# Patient Record
Sex: Male | Born: 1970
Health system: Southern US, Community
[De-identification: ages and names within clinical notes are randomized; demographics above are authoritative.]

## PROBLEM LIST (undated history)

## (undated) DIAGNOSIS — C801 Malignant (primary) neoplasm, unspecified: Secondary | ICD-10-CM

## (undated) HISTORY — PX: HERNIA REPAIR: SHX51

## (undated) HISTORY — PX: KNEE SURGERY: SHX244

## (undated) HISTORY — PX: PILONIDAL CYST EXCISION: SHX744

## (undated) HISTORY — PX: DENTAL SURGERY: SHX609

---

## 2001-04-17 ENCOUNTER — Ambulatory Visit (HOSPITAL_COMMUNITY): Admission: RE | Admit: 2001-04-17 | Discharge: 2001-04-17 | Payer: Self-pay | Admitting: *Deleted

## 2009-05-06 ENCOUNTER — Ambulatory Visit: Payer: Self-pay | Admitting: Cardiovascular Disease

## 2009-05-06 ENCOUNTER — Observation Stay (HOSPITAL_COMMUNITY): Admission: EM | Admit: 2009-05-06 | Discharge: 2009-05-07 | Payer: Self-pay | Admitting: Emergency Medicine

## 2010-06-24 LAB — URINALYSIS, ROUTINE W REFLEX MICROSCOPIC
Bilirubin Urine: NEGATIVE
Glucose, UA: NEGATIVE mg/dL
Hgb urine dipstick: NEGATIVE
Ketones, ur: NEGATIVE mg/dL
Nitrite: NEGATIVE
Protein, ur: NEGATIVE mg/dL
Specific Gravity, Urine: 1.006 (ref 1.005–1.030)
Urobilinogen, UA: 0.2 mg/dL (ref 0.0–1.0)
pH: 6 (ref 5.0–8.0)

## 2010-06-24 LAB — CBC
HCT: 50.3 % (ref 39.0–52.0)
Hemoglobin: 17.4 g/dL — ABNORMAL HIGH (ref 13.0–17.0)
MCHC: 34.5 g/dL (ref 30.0–36.0)
MCV: 90.8 fL (ref 78.0–100.0)
Platelets: 154 10*3/uL (ref 150–400)
RBC: 5.54 MIL/uL (ref 4.22–5.81)
RDW: 13.6 % (ref 11.5–15.5)
WBC: 13.1 10*3/uL — ABNORMAL HIGH (ref 4.0–10.5)

## 2010-06-24 LAB — DIFFERENTIAL
Basophils Absolute: 0 10*3/uL (ref 0.0–0.1)
Eosinophils Relative: 5 % (ref 0–5)
Lymphocytes Relative: 15 % (ref 12–46)
Lymphs Abs: 2 10*3/uL (ref 0.7–4.0)
Neutro Abs: 9.7 10*3/uL — ABNORMAL HIGH (ref 1.7–7.7)
Neutrophils Relative %: 74 % (ref 43–77)

## 2010-06-24 LAB — TROPONIN I
Troponin I: 0.01 ng/mL (ref 0.00–0.06)
Troponin I: 0.01 ng/mL (ref 0.00–0.06)

## 2010-06-24 LAB — BASIC METABOLIC PANEL
BUN: 6 mg/dL (ref 6–23)
CO2: 27 mEq/L (ref 19–32)
Calcium: 8.8 mg/dL (ref 8.4–10.5)
Chloride: 102 mEq/L (ref 96–112)
Creatinine, Ser: 0.97 mg/dL (ref 0.4–1.5)
GFR calc Af Amer: >60 mL/min (ref >60)
GFR calc non Af Amer: >60 mL/min (ref >60)
Glucose, Bld: 105 mg/dL — ABNORMAL HIGH (ref 70–99)
Potassium: 4.1 mEq/L (ref 3.5–5.1)
Sodium: 135 mEq/L (ref 135–145)

## 2010-06-24 LAB — CK TOTAL AND CKMB (NOT AT ARMC)
Relative Index: 1.1 (ref 0.0–2.5)
Relative Index: INVALID (ref 0.0–2.5)

## 2010-06-24 LAB — POCT CARDIAC MARKERS
CKMB, poc: <1 ng/mL — ABNORMAL LOW (ref 1.0–8.0)
Myoglobin, poc: 59.4 ng/mL (ref 12–200)
Troponin i, poc: <0.05 ng/mL (ref 0.00–0.09)

## 2014-10-31 ENCOUNTER — Ambulatory Visit (INDEPENDENT_AMBULATORY_CARE_PROVIDER_SITE_OTHER): Payer: Self-pay | Admitting: Family Medicine

## 2014-10-31 VITALS — BP 110/78 | HR 91 | Temp 97.9°F | Resp 18 | Ht 71.5 in | Wt 216.0 lb

## 2014-10-31 DIAGNOSIS — M545 Low back pain, unspecified: Secondary | ICD-10-CM

## 2014-10-31 DIAGNOSIS — S39012A Strain of muscle, fascia and tendon of lower back, initial encounter: Secondary | ICD-10-CM

## 2014-10-31 MED ORDER — MELOXICAM 15 MG PO TABS: 15.0000 mg | ORAL_TABLET | Freq: Every day | ORAL | Status: DC

## 2014-10-31 MED ORDER — METHOCARBAMOL 500 MG PO TABS: 500.0000 mg | ORAL_TABLET | Freq: Four times a day (QID) | ORAL | Status: DC

## 2014-10-31 NOTE — Patient Instructions (Signed)
Low Back Sprain with Rehab  A sprain is an injury in which a ligament is torn. The ligaments of the lower back are vulnerable to sprains. However, they are strong and require great force to be injured. These ligaments are important for stabilizing the spinal column. Sprains are classified into three categories. Grade 1 sprains cause pain, but the tendon is not lengthened. Grade 2 sprains include a lengthened ligament, due to the ligament being stretched or partially ruptured. With grade 2 sprains there is still function, although the function may be decreased. Grade 3 sprains involve a complete tear of the tendon or muscle, and function is usually impaired. SYMPTOMS   Severe pain in the lower back.  Sometimes, a feeling of a "pop," "snap," or tear, at the time of injury.  Tenderness and sometimes swelling at the injury site.  Uncommonly, bruising (contusion) within 48 hours of injury.  Muscle spasms in the back. CAUSES  Low back sprains occur when a force is placed on the ligaments that is greater than they can handle. Common causes of injury include:  Performing a stressful act while off-balance.  Repetitive stressful activities that involve movement of the lower back.  Direct hit (trauma) to the lower back. RISK INCREASES WITH:  Contact sports (football, wrestling).  Collisions (major skiing accidents).  Sports that require throwing or lifting (baseball, weightlifting).  Sports involving twisting of the spine (gymnastics, diving, tennis, golf).  Poor strength and flexibility.  Inadequate protection.  Previous back injury or surgery (especially fusion). PREVENTION  Wear properly fitted and padded protective equipment.  Warm up and stretch properly before activity.  Allow for adequate recovery between workouts.  Maintain physical fitness:  Strength, flexibility, and endurance.  Cardiovascular fitness.  Maintain a healthy body weight. PROGNOSIS  If treated  properly, low back sprains usually heal with non-surgical treatment. The length of time for healing depends on the severity of the injury.  RELATED COMPLICATIONS   Recurring symptoms, resulting in a chronic problem.  Chronic inflammation and pain in the low back.  Delayed healing or resolution of symptoms, especially if activity is resumed too soon.  Prolonged impairment.  Unstable or arthritic joints of the low back. TREATMENT  Treatment first involves the use of ice and medicine, to reduce pain and inflammation. The use of strengthening and stretching exercises may help reduce pain with activity. These exercises may be performed at home or with a therapist. Severe injuries may require referral to a therapist for further evaluation and treatment, such as ultrasound. Your caregiver may advise that you wear a back brace or corset, to help reduce pain and discomfort. Often, prolonged bed rest results in greater harm then benefit. Corticosteroid injections may be recommended. However, these should be reserved for the most serious cases. It is important to avoid using your back when lifting objects. At night, sleep on your back on a firm mattress, with a pillow placed under your knees. If non-surgical treatment is unsuccessful, surgery may be needed.  MEDICATION   If pain medicine is needed, nonsteroidal anti-inflammatory medicines (aspirin and ibuprofen), or other minor pain relievers (acetaminophen), are often advised.  Do not take pain medicine for 7 days before surgery.  Prescription pain relievers may be given, if your caregiver thinks they are needed. Use only as directed and only as much as you need.  Ointments applied to the skin may be helpful.  Corticosteroid injections may be given by your caregiver. These injections should be reserved for the most serious cases,   because they may only be given a certain number of times. HEAT AND COLD  Cold treatment (icing) should be applied for 10  to 15 minutes every 2 to 3 hours for inflammation and pain, and immediately after activity that aggravates your symptoms. Use ice packs or an ice massage.  Heat treatment may be used before performing stretching and strengthening activities prescribed by your caregiver, physical therapist, or athletic trainer. Use a heat pack or a warm water soak. SEEK MEDICAL CARE IF:   Symptoms get worse or do not improve in 2 to 4 weeks, despite treatment.  You develop numbness or weakness in either leg.  You lose bowel or bladder function.  Any of the following occur after surgery: fever, increased pain, swelling, redness, drainage of fluids, or bleeding in the affected area.  New, unexplained symptoms develop. (Drugs used in treatment may produce side effects.) EXERCISES  RANGE OF MOTION (ROM) AND STRETCHING EXERCISES - Low Back Sprain Most people with lower back pain will find that their symptoms get worse with excessive bending forward (flexion) or arching at the lower back (extension). The exercises that will help resolve your symptoms will focus on the opposite motion.  Your physician, physical therapist or athletic trainer will help you determine which exercises will be most helpful to resolve your lower back pain. Do not complete any exercises without first consulting with your caregiver. Discontinue any exercises which make your symptoms worse, until you speak to your caregiver. If you have pain, numbness or tingling which travels down into your buttocks, leg or foot, the goal of the therapy is for these symptoms to move closer to your back and eventually resolve. Sometimes, these leg symptoms will get better, but your lower back pain may worsen. This is often an indication of progress in your rehabilitation. Be very alert to any changes in your symptoms and the activities in which you participated in the 24 hours prior to the change. Sharing this information with your caregiver will allow him or her to  most efficiently treat your condition. These exercises may help you when beginning to rehabilitate your injury. Your symptoms may resolve with or without further involvement from your physician, physical therapist or athletic trainer. While completing these exercises, remember:   Restoring tissue flexibility helps normal motion to return to the joints. This allows healthier, less painful movement and activity.  An effective stretch should be held for at least 30 seconds.  A stretch should never be painful. You should only feel a gentle lengthening or release in the stretched tissue. FLEXION RANGE OF MOTION AND STRETCHING EXERCISES: STRETCH - Flexion, Single Knee to Chest   Lie on a firm bed or floor with both legs extended in front of you.  Keeping one leg in contact with the floor, bring your opposite knee to your chest. Hold your leg in place by either grabbing behind your thigh or at your knee.  Pull until you feel a gentle stretch in your low back. Hold __________ seconds.  Slowly release your grasp and repeat the exercise with the opposite side. Repeat __________ times. Complete this exercise __________ times per day.  STRETCH - Flexion, Double Knee to Chest  Lie on a firm bed or floor with both legs extended in front of you.  Keeping one leg in contact with the floor, bring your opposite knee to your chest.  Tense your stomach muscles to support your back and then lift your other knee to your chest. Hold your legs   in place by either grabbing behind your thighs or at your knees.  Pull both knees toward your chest until you feel a gentle stretch in your low back. Hold __________ seconds.  Tense your stomach muscles and slowly return one leg at a time to the floor. Repeat __________ times. Complete this exercise __________ times per day.  STRETCH - Low Trunk Rotation  Lie on a firm bed or floor. Keeping your legs in front of you, bend your knees so they are both pointed toward the  ceiling and your feet are flat on the floor.  Extend your arms out to the side. This will stabilize your upper body by keeping your shoulders in contact with the floor.  Gently and slowly drop both knees together to one side until you feel a gentle stretch in your low back. Hold for __________ seconds.  Tense your stomach muscles to support your lower back as you bring your knees back to the starting position. Repeat the exercise to the other side. Repeat __________ times. Complete this exercise __________ times per day  EXTENSION RANGE OF MOTION AND FLEXIBILITY EXERCISES: STRETCH - Extension, Prone on Elbows   Lie on your stomach on the floor, a bed will be too soft. Place your palms about shoulder width apart and at the height of your head.  Place your elbows under your shoulders. If this is too painful, stack pillows under your chest.  Allow your body to relax so that your hips drop lower and make contact more completely with the floor.  Hold this position for __________ seconds.  Slowly return to lying flat on the floor. Repeat __________ times. Complete this exercise __________ times per day.  RANGE OF MOTION - Extension, Prone Press Ups  Lie on your stomach on the floor, a bed will be too soft. Place your palms about shoulder width apart and at the height of your head.  Keeping your back as relaxed as possible, slowly straighten your elbows while keeping your hips on the floor. You may adjust the placement of your hands to maximize your comfort. As you gain motion, your hands will come more underneath your shoulders.  Hold this position __________ seconds.  Slowly return to lying flat on the floor. Repeat __________ times. Complete this exercise __________ times per day.  RANGE OF MOTION- Quadruped, Neutral Spine   Assume a hands and knees position on a firm surface. Keep your hands under your shoulders and your knees under your hips. You may place padding under your knees for  comfort.  Drop your head and point your tailbone toward the ground below you. This will round out your lower back like an angry cat. Hold this position for __________ seconds.  Slowly lift your head and release your tail bone so that your back sags into a large arch, like an old horse.  Hold this position for __________ seconds.  Repeat this until you feel limber in your low back.  Now, find your "sweet spot." This will be the most comfortable position somewhere between the two previous positions. This is your neutral spine. Once you have found this position, tense your stomach muscles to support your low back.  Hold this position for __________ seconds. Repeat __________ times. Complete this exercise __________ times per day.  STRENGTHENING EXERCISES - Low Back Sprain These exercises may help you when beginning to rehabilitate your injury. These exercises should be done near your "sweet spot." This is the neutral, low-back arch, somewhere between fully rounded   and fully arched, that is your least painful position. When performed in this safe range of motion, these exercises can be used for people who have either a flexion or extension based injury. These exercises may resolve your symptoms with or without further involvement from your physician, physical therapist or athletic trainer. While completing these exercises, remember:   Muscles can gain both the endurance and the strength needed for everyday activities through controlled exercises.  Complete these exercises as instructed by your physician, physical therapist or athletic trainer. Increase the resistance and repetitions only as guided.  You may experience muscle soreness or fatigue, but the pain or discomfort you are trying to eliminate should never worsen during these exercises. If this pain does worsen, stop and make certain you are following the directions exactly. If the pain is still present after adjustments, discontinue the  exercise until you can discuss the trouble with your caregiver. STRENGTHENING - Deep Abdominals, Pelvic Tilt   Lie on a firm bed or floor. Keeping your legs in front of you, bend your knees so they are both pointed toward the ceiling and your feet are flat on the floor.  Tense your lower abdominal muscles to press your low back into the floor. This motion will rotate your pelvis so that your tail bone is scooping upwards rather than pointing at your feet or into the floor. With a gentle tension and even breathing, hold this position for __________ seconds. Repeat __________ times. Complete this exercise __________ times per day.  STRENGTHENING - Abdominals, Crunches   Lie on a firm bed or floor. Keeping your legs in front of you, bend your knees so they are both pointed toward the ceiling and your feet are flat on the floor. Cross your arms over your chest.  Slightly tip your chin down without bending your neck.  Tense your abdominals and slowly lift your trunk high enough to just clear your shoulder blades. Lifting higher can put excessive stress on the lower back and does not further strengthen your abdominal muscles.  Control your return to the starting position. Repeat __________ times. Complete this exercise __________ times per day.  STRENGTHENING - Quadruped, Opposite UE/LE Lift   Assume a hands and knees position on a firm surface. Keep your hands under your shoulders and your knees under your hips. You may place padding under your knees for comfort.  Find your neutral spine and gently tense your abdominal muscles so that you can maintain this position. Your shoulders and hips should form a rectangle that is parallel with the floor and is not twisted.  Keeping your trunk steady, lift your right hand no higher than your shoulder and then your left leg no higher than your hip. Make sure you are not holding your breath. Hold this position for __________ seconds.  Continuing to keep  your abdominal muscles tense and your back steady, slowly return to your starting position. Repeat with the opposite arm and leg. Repeat __________ times. Complete this exercise __________ times per day.  STRENGTHENING - Abdominals and Quadriceps, Straight Leg Raise   Lie on a firm bed or floor with both legs extended in front of you.  Keeping one leg in contact with the floor, bend the other knee so that your foot can rest flat on the floor.  Find your neutral spine, and tense your abdominal muscles to maintain your spinal position throughout the exercise.  Slowly lift your straight leg off the floor about 6 inches for a count   of 15, making sure to not hold your breath.  Still keeping your neutral spine, slowly lower your leg all the way to the floor. Repeat this exercise with each leg __________ times. Complete this exercise __________ times per day. POSTURE AND BODY MECHANICS CONSIDERATIONS - Low Back Sprain Keeping correct posture when sitting, standing or completing your activities will reduce the stress put on different body tissues, allowing injured tissues a chance to heal and limiting painful experiences. The following are general guidelines for improved posture. Your physician or physical therapist will provide you with any instructions specific to your needs. While reading these guidelines, remember:  The exercises prescribed by your provider will help you have the flexibility and strength to maintain correct postures.  The correct posture provides the best environment for your joints to work. All of your joints have less wear and tear when properly supported by a spine with good posture. This means you will experience a healthier, less painful body.  Correct posture must be practiced with all of your activities, especially prolonged sitting and standing. Correct posture is as important when doing repetitive low-stress activities (typing) as it is when doing a single heavy-load  activity (lifting). RESTING POSITIONS Consider which positions are most painful for you when choosing a resting position. If you have pain with flexion-based activities (sitting, bending, stooping, squatting), choose a position that allows you to rest in a less flexed posture. You would want to avoid curling into a fetal position on your side. If your pain worsens with extension-based activities (prolonged standing, working overhead), avoid resting in an extended position such as sleeping on your stomach. Most people will find more comfort when they rest with their spine in a more neutral position, neither too rounded nor too arched. Lying on a non-sagging bed on your side with a pillow between your knees, or on your back with a pillow under your knees will often provide some relief. Keep in mind, being in any one position for a prolonged period of time, no matter how correct your posture, can still lead to stiffness. PROPER SITTING POSTURE In order to minimize stress and discomfort on your spine, you must sit with correct posture. Sitting with good posture should be effortless for a healthy body. Returning to good posture is a gradual process. Many people can work toward this most comfortably by using various supports until they have the flexibility and strength to maintain this posture on their own. When sitting with proper posture, your ears will fall over your shoulders and your shoulders will fall over your hips. You should use the back of the chair to support your upper back. Your lower back will be in a neutral position, just slightly arched. You may place a small pillow or folded towel at the base of your lower back for  support.  When working at a desk, create an environment that supports good, upright posture. Without extra support, muscles tire, which leads to excessive strain on joints and other tissues. Keep these recommendations in mind: CHAIR:  A chair should be able to slide under your desk  when your back makes contact with the back of the chair. This allows you to work closely.  The chair's height should allow your eyes to be level with the upper part of your monitor and your hands to be slightly lower than your elbows. BODY POSITION  Your feet should make contact with the floor. If this is not possible, use a foot rest.  Keep your   ears over your shoulders. This will reduce stress on your neck and low back. INCORRECT SITTING POSTURES  If you are feeling tired and unable to assume a healthy sitting posture, do not slouch or slump. This puts excessive strain on your back tissues, causing more damage and pain. Healthier options include:  Using more support, like a lumbar pillow.  Switching tasks to something that requires you to be upright or walking.  Talking a brief walk.  Lying down to rest in a neutral-spine position. PROLONGED STANDING WHILE SLIGHTLY LEANING FORWARD  When completing a task that requires you to lean forward while standing in one place for a long time, place either foot up on a stationary 2-4 inch high object to help maintain the best posture. When both feet are on the ground, the lower back tends to lose its slight inward curve. If this curve flattens (or becomes too large), then the back and your other joints will experience too much stress, tire more quickly, and can cause pain. CORRECT STANDING POSTURES Proper standing posture should be assumed with all daily activities, even if they only take a few moments, like when brushing your teeth. As in sitting, your ears should fall over your shoulders and your shoulders should fall over your hips. You should keep a slight tension in your abdominal muscles to brace your spine. Your tailbone should point down to the ground, not behind your body, resulting in an over-extended swayback posture.  INCORRECT STANDING POSTURES  Common incorrect standing postures include a forward head, locked knees and/or an excessive  swayback. WALKING Walk with an upright posture. Your ears, shoulders and hips should all line-up. PROLONGED ACTIVITY IN A FLEXED POSITION When completing a task that requires you to bend forward at your waist or lean over a low surface, try to find a way to stabilize 3 out of 4 of your limbs. You can place a hand or elbow on your thigh or rest a knee on the surface you are reaching across. This will provide you more stability, so that your muscles do not tire as quickly. By keeping your knees relaxed, or slightly bent, you will also reduce stress across your lower back. CORRECT LIFTING TECHNIQUES DO :  Assume a wide stance. This will provide you more stability and the opportunity to get as close as possible to the object which you are lifting.  Tense your abdominals to brace your spine. Bend at the knees and hips. Keeping your back locked in a neutral-spine position, lift using your leg muscles. Lift with your legs, keeping your back straight.  Test the weight of unknown objects before attempting to lift them.  Try to keep your elbows locked down at your sides in order get the best strength from your shoulders when carrying an object.  Always ask for help when lifting heavy or awkward objects. INCORRECT LIFTING TECHNIQUES DO NOT:   Lock your knees when lifting, even if it is a small object.  Bend and twist. Pivot at your feet or move your feet when needing to change directions.  Assume that you can safely pick up even a paperclip without proper posture. Document Released: 03/20/2005 Document Revised: 06/12/2011 Document Reviewed: 07/02/2008 [employer] Patient Information 2015 [employer]. This information is not intended to replace advice given to you by your health care provider. Make sure you discuss any questions you have with your health care provider.  

## 2014-10-31 NOTE — Progress Notes (Signed)
Subjective:    Patient ID: Aaron Davidson, male    DOB: December 14, 1970, 44 y.o.   MRN: [id number]  10/31/2014  Back Pain   HPI This 44 y.o. male presents for evaluation of lower back pain.  Does appliance work and drives frequently.  Onset two months ago.  Pain moves from one side of back to the other. Taking Tylenol and Naproxen every morning with persistence; will repeat Tylenol dosing throughout the day.  No previous evaluation for pain.  No injury or trigger; lifts constantly.  Routes have been elongated; traveling more.  B lower back and radiates up. No radiation into legs except into L hip intermittently.  No n/t/burning in legs or arms.  No weakness.  L hip after mowing hurts really badly; has a high heel; push mower.  B/b function intact.  No saddle paresthesias.  Heat and ice; heating pad more with some relief.  No nighttime awakening.  PCP:  none   Review of Systems  Constitutional: Negative for fever, chills, diaphoresis and fatigue.  Genitourinary: Negative for decreased urine volume and difficulty urinating.  Musculoskeletal: Positive for myalgias and back pain. Negative for gait problem.  Skin: Negative for rash.  Neurological: Negative for weakness and numbness.    History reviewed. No pertinent past medical history. Past Surgical History  Procedure Laterality Date  . Knee surgery Right    Allergies  Allergen Reactions  . Ultram [Tramadol Hcl] Hives, Itching and Rash   Current Outpatient Prescriptions  Medication Sig Dispense Refill  . meloxicam (MOBIC) 15 MG tablet Take 1 tablet (15 mg total) by mouth daily. 30 tablet 0  . methocarbamol (ROBAXIN) 500 MG tablet Take 1-2 tablets (500-1,000 mg total) by mouth 4 (four) times daily. 40 tablet 1   No current facility-administered medications for this visit.   History   Social History  . Marital Status: Single    Spouse Name: N/A  . Number of Children: N/A  . Years of Education: N/A   Occupational History  .  Not on file.   Social History Main Topics  . Smoking status: Current Every Day Smoker -- 1.00 packs/day for 36 years    Types: Cigarettes  . Smokeless tobacco: Not on file  . Alcohol Use: 0.6 oz/week    1 Standard drinks or equivalent per week  . Drug Use: No  . Sexual Activity: Not on file   Other Topics Concern  . Not on file   Social History Narrative  . No narrative on file       Objective:    BP 110/78 mmHg  Pulse 91  Temp(Src) 97.9 F (36.6 C) (Oral)  Resp 18  Ht 5' 11.5" (1.816 m)  Wt 216 lb (97.977 kg)  BMI 29.71 kg/m2  SpO2 99% Physical Exam  Constitutional: He is oriented to person, place, and time. He appears well-developed and well-nourished. No distress.  HENT:  Head: Normocephalic and atraumatic.  Eyes: Conjunctivae and EOM are normal. Pupils are equal, round, and reactive to light.  Neck: Normal range of motion. Neck supple. Carotid bruit is not present. No thyromegaly present.  Cardiovascular: Intact distal pulses.   Pulmonary/Chest: Effort normal and breath sounds normal. He has no wheezes. He has no rales.  Musculoskeletal:       Lumbar back: He exhibits pain. He exhibits normal range of motion, no tenderness and no bony tenderness.  Lumbar spine:  Non-tender midline; non-tender paraspinal regions B.  Straight leg raises negative B; toe and heel  walking intact; marching intact; motor 5/5 BLE.  Full ROM lumbar spine without limitation. +pain with extension of back and bending and rotating side to side.   Lymphadenopathy:    He has no cervical adenopathy.  Neurological: He is alert and oriented to person, place, and time. No cranial nerve deficit.  Skin: Skin is warm and dry. No rash noted. He is not diaphoretic.  Psychiatric: He has a normal mood and affect. His behavior is normal.  Nursing note and vitals reviewed.       Assessment & Plan:   1. Bilateral low back pain without sciatica   2. Lumbar strain, initial encounter    -New. -Treat  with Meloxicam and Robaxin daily. -Continue heat bid for 15-20 minutes. -Home exercise program provived to perform daily. -If no improvement in 4-6 weeks, RTC for lumbar films.   Meds ordered this encounter  Medications  . meloxicam (MOBIC) 15 MG tablet    Sig: Take 1 tablet (15 mg total) by mouth daily.    Dispense:  30 tablet    Refill:  0  . methocarbamol (ROBAXIN) 500 MG tablet    Sig: Take 1-2 tablets (500-1,000 mg total) by mouth 4 (four) times daily.    Dispense:  40 tablet    Refill:  1    No Follow-up on file.    Jaice Digioia Elayne Guerin, M.D. [facility] [address] [place], [place]  [address] [phone] phone [phone] fax

## 2014-11-16 ENCOUNTER — Other Ambulatory Visit: Payer: Self-pay | Admitting: Family Medicine

## 2015-08-31 ENCOUNTER — Emergency Department (HOSPITAL_COMMUNITY): Payer: Self-pay

## 2015-08-31 ENCOUNTER — Emergency Department (HOSPITAL_COMMUNITY)
Admission: EM | Admit: 2015-08-31 | Discharge: 2015-09-01 | Disposition: A | Discharge status: Home / Self Care | Payer: Self-pay | Attending: Emergency Medicine | Admitting: Emergency Medicine

## 2015-08-31 ENCOUNTER — Encounter (HOSPITAL_COMMUNITY): Payer: Self-pay

## 2015-08-31 DIAGNOSIS — F17210 Nicotine dependence, cigarettes, uncomplicated: Secondary | ICD-10-CM | POA: Insufficient documentation

## 2015-08-31 DIAGNOSIS — Z791 Long term (current) use of non-steroidal anti-inflammatories (NSAID): Secondary | ICD-10-CM | POA: Insufficient documentation

## 2015-08-31 DIAGNOSIS — R1032 Left lower quadrant pain: Secondary | ICD-10-CM | POA: Insufficient documentation

## 2015-08-31 MED ORDER — KETOROLAC TROMETHAMINE 60 MG/2ML IM SOLN
60.0000 mg | Freq: Once | INTRAMUSCULAR | Status: AC
  Administered 2015-08-31: 60 mg via INTRAMUSCULAR
  Filled 2015-08-31: qty 2

## 2015-08-31 NOTE — ED Provider Notes (Signed)
CSN: [id number]     Arrival date & time 08/31/15  1802 History  By signing my name below, I, Aaron Davidson, attest that this documentation has been prepared under the direction and in the presence of Aaron Amick, MD.  Electronically Signed: [name], ED Scribe. 08/31/2015. 11:16 PM  Chief Complaint  Patient presents with  . Groin Pain   Patient is a 45 y.o. male presenting with groin pain. The history is provided by the patient. No language interpreter was used.  Groin Pain This is a recurrent problem. The current episode started more than 1 week ago. The problem occurs constantly. The problem has not changed since onset.Associated symptoms include abdominal pain. Pertinent negatives include no chest pain. Nothing aggravates the symptoms. Nothing relieves the symptoms. He has tried nothing for the symptoms. The treatment provided no relief.   HPI Comments: Aaron Davidson is a 45 y.o. male who presents to the Emergency Department complaining of unchanged lower back and groin pain that beagn 6 months ago. Pt reports that he has associated vomiting. Pt reports that there is an area where he has a bulging and he can "move it back in". Pt has taken Aleve and Vicodin for his pain. Pt was last seen in July for similar symptoms. Pt reports that the area is painful to palpation. Pt denies dysuria, hematuria, frequency, or difficulty urinating. No other alleviating factors noted.    History reviewed. No pertinent past medical history. Past Surgical History  Procedure Laterality Date  . Knee surgery Right    Family History  Problem Relation Age of Onset  . Heart disease Father    Social History  Substance Use Topics  . Smoking status: Current Every Day Smoker -- 1.00 packs/day for 36 years    Types: Cigarettes  . Smokeless tobacco: None  . Alcohol Use: 0.6 oz/week    1 Standard drinks or equivalent per week    Review of Systems  Cardiovascular: Negative for chest pain.   Gastrointestinal: Positive for vomiting and abdominal pain.  Genitourinary: Negative for dysuria, frequency, hematuria and difficulty urinating.  All other systems reviewed and are negative.   Allergies  Ultram  Home Medications   Prior to Admission medications   Medication Sig Start Date End Date Taking? Authorizing Provider  meloxicam (MOBIC) 15 MG tablet Take 1 tablet (15 mg total) by mouth daily. 10/31/14   Wardell Honour, MD  methocarbamol (ROBAXIN) 500 MG tablet take 1 to 2 tablets by mouth four times a day 11/17/14   Wardell Honour, MD   BP 130/85 mmHg  Pulse 80  Temp(Src) 98 F (36.7 C) (Oral)  Resp 18  SpO2 100%   Physical Exam  Constitutional: He is oriented to person, place, and time. He appears well-developed and well-nourished.  HENT:  Head: Normocephalic and atraumatic.  Mouth/Throat: Oropharynx is clear and moist.  Eyes: Conjunctivae and EOM are normal. Pupils are equal, round, and reactive to light.  Neck: Normal range of motion. Neck supple.  Cardiovascular: Normal rate, regular rhythm and normal heart sounds.  Exam reveals no gallop and no friction rub.   No murmur heard. RRR.   Pulmonary/Chest: Effort normal and breath sounds normal. No respiratory distress. He has no wheezes. He has no rales.  Lungs CTA bilaterally.   Abdominal: Soft. Bowel sounds are normal. He exhibits no distension and no mass. There is no tenderness. There is no rigidity, no rebound, no guarding, no tenderness at McBurney's point and negative Murphy's sign.  No hernia.  No herniation noted upon exam.  Genitourinary: Testes normal and penis normal. No penile tenderness.  Musculoskeletal: Normal range of motion.  Neurological: He is alert and oriented to person, place, and time. He has normal reflexes.  Skin: Skin is warm and dry.  Psychiatric: He has a normal mood and affect.  Nursing note and vitals reviewed.  Chaperone present throughout entire exam.  ED Course  Procedures  (including critical care time)  DIAGNOSTIC STUDIES: Oxygen Saturation is 100% on RA, normal by my interpretation.   COORDINATION OF CARE: 11:08 PM-Discussed next steps with pt including UA and DG Abdomen. Pt verbalized understanding and is agreeable with the plan.   Labs Review Labs Reviewed - No data to display  Imaging Review No results found. I have personally reviewed and evaluated these images and lab results as part of my medical decision-making.   EKG Interpretation None      MDM   Final diagnoses:  None   Filed Vitals:   08/31/15 2249 09/01/15 0111  BP: 130/85 126/86  Pulse: 80 65  Temp: 98 F (36.7 C)   Resp: 18 16   Results for orders placed or performed during the hospital encounter of 08/31/15  Urinalysis, Routine w reflex microscopic (not at [facility])  Result Value Ref Range   Color, Urine YELLOW YELLOW   APPearance CLEAR CLEAR   Specific Gravity, Urine 1.006 1.005 - 1.030   pH 6.5 5.0 - 8.0   Glucose, UA NEGATIVE NEGATIVE mg/dL   Hgb urine dipstick LARGE (A) NEGATIVE   Bilirubin Urine NEGATIVE NEGATIVE   Ketones, ur NEGATIVE NEGATIVE mg/dL   Protein, ur NEGATIVE NEGATIVE mg/dL   Nitrite NEGATIVE NEGATIVE   Leukocytes, UA TRACE (A) NEGATIVE  Urine microscopic-add on  Result Value Ref Range   Squamous Epithelial / LPF NONE SEEN NONE SEEN   WBC, UA 0-5 0 - 5 WBC/hpf   RBC / HPF NONE SEEN 0 - 5 RBC/hpf   Bacteria, UA NONE SEEN NONE SEEN   Dg Abd Acute W/chest  08/31/2015  CLINICAL DATA:  Chronic abdominal pain EXAM: DG ABDOMEN ACUTE W/ 1V CHEST COMPARISON:  Chest radiograph dated 05/06/2009 FINDINGS: Lungs are clear.  No pleural effusion or pneumothorax. The heart is normal in size. Nonobstructive bowel gas pattern. Mild to moderate colonic stool burden. Visualized osseous structures are within normal limits. IMPRESSION: No evidence of acute cardiopulmonary disease. No evidence of small bowel obstruction or free air. Electronically Signed   By: [name] M.D.   On: 08/31/2015 23:49     No signs of a hernia at this time.  Patient is constipated.  Start miralax.  Follow up with surgery.  Strict return precautions given   I personally performed the services described in this documentation, which was scribed in my presence. The recorded information has been reviewed and is accurate.       Veatrice Kells, MD 09/01/15 [phone]

## 2015-08-31 NOTE — ED Notes (Signed)
Pt here with lower abdominal/groin pain.  Pt has area where he can push protrusion back "in place". ? Hernia.  Has not seen MD.  Has occurred for months but pain is getting worse.  No testicular pain.

## 2015-08-31 NOTE — ED Notes (Signed)
Pt transported to XR.  

## 2015-08-31 NOTE — ED Notes (Signed)
Pt unable to get urine sample at this time. Has urinal at bedside

## 2015-08-31 NOTE — ED Notes (Signed)
Pt instructed that we need urine.

## 2015-09-01 ENCOUNTER — Encounter (HOSPITAL_COMMUNITY): Payer: Self-pay | Admitting: Emergency Medicine

## 2015-09-01 LAB — URINALYSIS, ROUTINE W REFLEX MICROSCOPIC
Bilirubin Urine: NEGATIVE
Glucose, UA: NEGATIVE mg/dL
KETONES UR: NEGATIVE mg/dL
NITRITE: NEGATIVE
PH: 6.5 (ref 5.0–8.0)
Protein, ur: NEGATIVE mg/dL
SPECIFIC GRAVITY, URINE: 1.006 (ref 1.005–1.030)

## 2015-09-01 LAB — URINE MICROSCOPIC-ADD ON
Bacteria, UA: NONE SEEN
RBC / HPF: NONE SEEN RBC/hpf (ref 0–5)
SQUAMOUS EPITHELIAL / LPF: NONE SEEN

## 2015-09-01 MED ORDER — DICYCLOMINE HCL 20 MG PO TABS: 20.0000 mg | ORAL_TABLET | Freq: Two times a day (BID) | ORAL | Status: DC

## 2015-09-01 MED ORDER — DICLOFENAC SODIUM ER 100 MG PO TB24
100.0000 mg | ORAL_TABLET | Freq: Every day | ORAL | Status: DC
Start: 2015-09-01 — End: 2015-12-13

## 2015-11-05 ENCOUNTER — Ambulatory Visit (INDEPENDENT_AMBULATORY_CARE_PROVIDER_SITE_OTHER): Payer: Self-pay | Admitting: Physician Assistant

## 2015-11-05 VITALS — BP 122/72 | HR 95 | Temp 98.3°F | Resp 17 | Ht 71.0 in | Wt 187.0 lb

## 2015-11-05 DIAGNOSIS — R109 Unspecified abdominal pain: Secondary | ICD-10-CM

## 2015-11-05 DIAGNOSIS — K4090 Unilateral inguinal hernia, without obstruction or gangrene, not specified as recurrent: Secondary | ICD-10-CM

## 2015-11-05 NOTE — Patient Instructions (Addendum)
You have an appointmentment with Dr. Rosendo Gros on the 16th of this month.  Please go to [address] for this appointment.  Dial [phone] for your appointment time. The surgery practice is [facility]..     IF you received an x-ray today, you will receive an invoice from [facility]. Please contact [facility] at [phone] with questions or concerns regarding your invoice.   IF you received labwork today, you will receive an invoice from [employer]. Please contact [facility] at [phone] with questions or concerns regarding your invoice.   Our billing staff will not be able to assist you with questions regarding bills from these companies.  You will be contacted with the lab results as soon as they are available. The fastest way to get your results is to activate your My Chart account. Instructions are located on the last page of this paperwork. If you have not heard from [place] regarding the results in 2 weeks, please contact this office.

## 2015-11-05 NOTE — Progress Notes (Signed)
   11/05/2015 2:27 PM   DOB: 1970-09-18 / MRN: [id number]  SUBJECTIVE:  Aaron Davidson is a 45 y.o. male presenting for abdominal pain that has been present for 3 months.  He thinks this may be a hernia.  Reports he has been seen in the ED and advised he had "a defect in the abdominal wall."  He was advised to see a surgeon and he called a surgeon who reports he needs to be "diagnosed."  Reports he is defecating every other which is about 1/2 normal for him. He taking castor oil once every weekend.    He has no active allergies.   He  has no past medical history on file.    He  reports that he has been smoking Cigarettes.  He has a 36.00 pack-year smoking history. He does not have any smokeless tobacco history on file. He reports that he drinks about 0.6 oz of alcohol per week . He reports that he does not use drugs. He  has no sexual activity history on file. The patient  has a past surgical history that includes Knee surgery (Right).  His family history includes Heart disease in his father.  Review of Systems  Constitutional: Negative for chills and fever.  Gastrointestinal: Positive for abdominal pain. Negative for constipation, diarrhea, heartburn and nausea.  Musculoskeletal: Negative for myalgias.  Skin: Negative for itching and rash.  Neurological: Negative for dizziness and headaches.    The problem list and medications were reviewed and updated by myself where necessary and exist elsewhere in the encounter.   OBJECTIVE:  BP 122/72 (BP Location: Right Arm, Patient Position: Sitting, Cuff Size: Normal)   Pulse 95   Temp 98.3 F (36.8 C) (Oral)   Resp 17   Ht 5\' 11"  (1.803 m)   Wt 187 lb (84.8 kg)   SpO2 95%   BMI 26.08 kg/m   Physical Exam  Constitutional: He appears well-developed and well-nourished.  Cardiovascular: Normal rate and regular rhythm.   Pulmonary/Chest: Effort normal and breath sounds normal.  Abdominal: There is tenderness (about the left inguinal  area).      No results found for this or any previous visit (from the past 72 hour(s)).  No results found.  ASSESSMENT AND PLAN  Aaron Davidson was seen today for other.  Diagnoses and all orders for this visit:  Abdominal pain, unspecified abdominal location: He clearly has a reducible hernia.  He is a self pay patient and I see no indication of strangulation at this time.  I have made him an appointment with central [place] surgery for the 16th of this month for consult.   Inguinal hernia, left: [phone] to fax notes to surgery.     The patient is advised to call or return to clinic if he does not see an improvement in symptoms, or to seek the care of the closest emergency department if he worsens with the above plan.   Philis Fendt, MHS, PA-C [facility] 11/05/2015 2:27 PM

## 2015-11-17 ENCOUNTER — Ambulatory Visit: Payer: Self-pay | Admitting: General Surgery

## 2015-11-17 NOTE — H&P (Signed)
History of Present Illness Ralene Ok MD; 11/17/2015 11:05 AM) Patient words: New Pt [place].  The patient is a 45 year old male who presents with an inguinal hernia. The patient is a 45 year old male who is referred by Philis Fendt, PA-C for an evaluation of a left inguinal hernia. Patient states that this there for several weeks. He states that it causes some acute pain. He states he is able to reduce it with some effort. 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  Patient has had no signs or symptoms of incarceration or strangulation.  Patient is a 2 pack per day smoker.   Allergies Sander Nephew, CMA; 11/17/2015 10:57 AM) No Known Drug Allergies08/16/2017  Medication History Sander Nephew, CMA; 11/17/2015 10:59 AM) Acetaminophen (500MG  Tablet, Oral) Active. Naproxen (500MG  Tablet, Oral) Active. Medications Reconciled    Review of Systems Ralene Ok, MD; 11/17/2015 11:6 AM) General Not Present- Fever. Respiratory Not Present- Cough and Difficulty Breathing. Cardiovascular Not Present- Chest Pain. Musculoskeletal Not Present- Myalgia. Neurological Not Present- Weakness.  Vitals Georgia Lopes Albanese CMA; 11/17/2015 10:56 AM) 11/17/2015 10:55 AM Weight: 185 lb Height: 71in Body Surface Area: 2.04 m Body Mass Index: 25.8 kg/m  Temp.: 98.1F(Oral)  Pulse: 60 (Regular)  Resp.: 18 (Unlabored)  BP: 128/62 (Sitting, Left Arm, Standard)       Physical Exam Ralene Ok, MD; 11/17/2015 11:6 AM) General Mental Status-Alert. General Appearance-Consistent with stated age. Hydration-Well hydrated. Voice-Normal.  Head and Neck Head-normocephalic, atraumatic with no lesions or palpable masses. Trachea-midline.  Eye Eyeball - Bilateral-Extraocular movements intact. Sclera/Conjunctiva - Bilateral-No scleral icterus.  Chest and Lung Exam Chest and lung exam reveals -quiet, even and  easy respiratory effort with no use of accessory muscles. Inspection Chest Wall - Normal. Back - normal.  Cardiovascular Cardiovascular examination reveals -normal heart sounds, regular rate and rhythm with no murmurs.  Abdomen Inspection Skin - Scar - no surgical scars. Hernias - Inguinal hernia - Left - Reducible. Palpation/Percussion Normal exam - Soft, Non Tender, No Rebound tenderness, No Rigidity (guarding) and No hepatosplenomegaly. Auscultation Normal exam - Bowel sounds normal.  Neurologic Neurologic evaluation reveals -alert and oriented x 3 with no impairment of recent or remote memory. Mental Status-Normal.  Musculoskeletal Normal Exam - Left-Upper Extremity Strength Normal and Lower Extremity Strength Normal. Normal Exam - Right-Upper Extremity Strength Normal, Lower Extremity Weakness.    Assessment & Plan Ralene Ok MD; 11/17/2015 11:06 AM) LEFT INGUINAL HERNIA (K40.90) Impression: 45 year old male with a left inguinal hernia.  1. The patient will like to proceed to the operating room for laparoscopic left inguinal hernia repair with mesh.  2. I discussed with the patient the signs and symptoms of incarceration and strangulation and the need to proceed to the ER should they occur.  3. I discussed with the patient the risks and benefits of the procedure to include but not limited to: Infection, bleeding, damage to surrounding structures, possible need for further surgery, possible nerve pain, and possible recurrence. The patient was understanding and wishes to proceed.

## 2015-12-09 ENCOUNTER — Encounter (HOSPITAL_COMMUNITY): Payer: Self-pay

## 2015-12-09 NOTE — Pre-Procedure Instructions (Signed)
Aaron Davidson  12/09/2015     Your procedure is scheduled on : Monday December 13, 2015 at 1:00 PM.  Report to [facility] Admitting at 11:00 AM.  Call this number if you have problems the morning of surgery: [phone]    Remember:  Do not eat food or drink liquids after midnight.  Take these medicines the morning of surgery with A SIP OF WATER : Acetaminophen (Tylenol) if needed   Stop taking any vitamins, herbal medications/supplements, NSAIDs, Ibuprofen, Advil, Motrin, Aleve/Naproxen, etc  today   Do not wear jewelry.  Do not wear lotions, powders, or cologne, or deoderant.  Men may shave face and neck.  Do not bring valuables to the hospital.  [facility] is not responsible for any belongings or valuables.  Contacts, dentures or bridgework may not be worn into surgery.  Leave your suitcase in the car.  After surgery it may be brought to your room.  For patients admitted to the hospital, discharge time will be determined by your treatment team.  Patients discharged the day of surgery will not be allowed to drive home.   Name and phone number of your driver:    Special instructions:  Shower using CHG soap the night before and the morning of your surgery  Please read over the following fact sheets that you were given. Pain Booklet

## 2015-12-10 ENCOUNTER — Encounter (HOSPITAL_COMMUNITY): Payer: Self-pay

## 2015-12-10 ENCOUNTER — Encounter (HOSPITAL_COMMUNITY)
Admission: RE | Admit: 2015-12-10 | Discharge: 2015-12-10 | Disposition: A | Discharge status: Home / Self Care | Payer: Self-pay | Source: Ambulatory Visit | Attending: General Surgery | Admitting: General Surgery

## 2015-12-10 DIAGNOSIS — Z01818 Encounter for other preprocedural examination: Secondary | ICD-10-CM | POA: Insufficient documentation

## 2015-12-10 LAB — CBC
HCT: 48.5 % (ref 39.0–52.0)
HEMOGLOBIN: 16.4 g/dL (ref 13.0–17.0)
MCH: 30 pg (ref 26.0–34.0)
MCHC: 33.8 g/dL (ref 30.0–36.0)
MCV: 88.8 fL (ref 78.0–100.0)
PLATELETS: 158 10*3/uL (ref 150–400)
RBC: 5.46 MIL/uL (ref 4.22–5.81)
RDW: 14.2 % (ref 11.5–15.5)
WBC: 11.5 10*3/uL — ABNORMAL HIGH (ref 4.0–10.5)

## 2015-12-10 MED ORDER — CHLORHEXIDINE GLUCONATE CLOTH 2 % EX PADS: 6.0000 | MEDICATED_PAD | Freq: Once | CUTANEOUS | Status: DC

## 2015-12-10 MED ORDER — CEFAZOLIN SODIUM-DEXTROSE 2-4 GM/100ML-% IV SOLN
2.0000 g | INTRAVENOUS | Status: AC
  Administered 2015-12-13: 2 g via INTRAVENOUS
  Filled 2015-12-10: qty 100

## 2015-12-10 NOTE — Progress Notes (Signed)
Patient denied having a PCP or any cardiac or pulmonary issues  Patient informed Nurse that he had a stress test at [facility] about 5 or 6 years ago because he was having chest discomfort, however it was negative and he denied having any cardiac issues since.  Patient educated on smoking cessation. Patient verbalized understanding and stated "do you know how hard it's going to be to not smoke for 24 hours?...Marland KitchenMarland KitchenWell, I'ma do the best I can."

## 2015-12-13 ENCOUNTER — Ambulatory Visit (HOSPITAL_COMMUNITY): Payer: Self-pay | Admitting: Anesthesiology

## 2015-12-13 ENCOUNTER — Encounter (HOSPITAL_COMMUNITY)
Admission: RE | Disposition: A | Discharge status: Home / Self Care | Payer: Self-pay | Source: Ambulatory Visit | Attending: General Surgery

## 2015-12-13 ENCOUNTER — Ambulatory Visit (HOSPITAL_COMMUNITY)
Admission: RE | Admit: 2015-12-13 | Discharge: 2015-12-13 | Disposition: A | Discharge status: Home / Self Care | Payer: Self-pay | Source: Ambulatory Visit | Attending: General Surgery | Admitting: General Surgery

## 2015-12-13 ENCOUNTER — Encounter (HOSPITAL_COMMUNITY): Payer: Self-pay | Admitting: *Deleted

## 2015-12-13 DIAGNOSIS — F17200 Nicotine dependence, unspecified, uncomplicated: Secondary | ICD-10-CM | POA: Insufficient documentation

## 2015-12-13 DIAGNOSIS — K4090 Unilateral inguinal hernia, without obstruction or gangrene, not specified as recurrent: Secondary | ICD-10-CM | POA: Insufficient documentation

## 2015-12-13 HISTORY — PX: INSERTION OF MESH: SHX5868

## 2015-12-13 HISTORY — PX: INGUINAL HERNIA REPAIR: SHX194

## 2015-12-13 SURGERY — REPAIR, HERNIA, INGUINAL, LAPAROSCOPIC
Anesthesia: General | Site: Inguinal | Laterality: Left

## 2015-12-13 MED ORDER — LIDOCAINE HCL (CARDIAC) 20 MG/ML IV SOLN
INTRAVENOUS | Status: DC | PRN
  Administered 2015-12-13: 40 mg via INTRAVENOUS
  Administered 2015-12-13: 60 mg via INTRAVENOUS

## 2015-12-13 MED ORDER — BUPIVACAINE HCL 0.25 % IJ SOLN
INTRAMUSCULAR | Status: DC | PRN
  Administered 2015-12-13: 4 mL

## 2015-12-13 MED ORDER — OXYCODONE HCL 5 MG PO TABS: 5.0000 mg | ORAL_TABLET | Freq: Once | ORAL | Status: DC | PRN

## 2015-12-13 MED ORDER — DEXAMETHASONE SODIUM PHOSPHATE 10 MG/ML IJ SOLN
INTRAMUSCULAR | Status: DC | PRN
  Administered 2015-12-13: 10 mg via INTRAVENOUS

## 2015-12-13 MED ORDER — ONDANSETRON HCL 4 MG/2ML IJ SOLN
INTRAMUSCULAR | Status: DC | PRN
  Administered 2015-12-13 (×2): 4 mg via INTRAVENOUS

## 2015-12-13 MED ORDER — FENTANYL CITRATE (PF) 100 MCG/2ML IJ SOLN
INTRAMUSCULAR | Status: AC
  Filled 2015-12-13: qty 2

## 2015-12-13 MED ORDER — ACETAMINOPHEN 325 MG PO TABS: 325.0000 mg | ORAL_TABLET | ORAL | Status: DC | PRN

## 2015-12-13 MED ORDER — HYDROMORPHONE HCL 1 MG/ML IJ SOLN
INTRAMUSCULAR | Status: AC
  Filled 2015-12-13: qty 1

## 2015-12-13 MED ORDER — ACETAMINOPHEN 10 MG/ML IV SOLN
INTRAVENOUS | Status: DC | PRN
  Administered 2015-12-13: 1000 mg via INTRAVENOUS

## 2015-12-13 MED ORDER — LACTATED RINGERS IV SOLN
INTRAVENOUS | Status: DC
  Administered 2015-12-13: 12:00:00 via INTRAVENOUS

## 2015-12-13 MED ORDER — ACETAMINOPHEN 160 MG/5ML PO SOLN
325.0000 mg | ORAL | Status: DC | PRN
  Filled 2015-12-13: qty 20.3

## 2015-12-13 MED ORDER — OXYCODONE HCL 5 MG PO TABS
5.0000 mg | ORAL_TABLET | ORAL | Status: DC | PRN
  Administered 2015-12-13: 10 mg via ORAL

## 2015-12-13 MED ORDER — SODIUM CHLORIDE 0.9 % IV SOLN: 250.0000 mL | INTRAVENOUS | Status: DC | PRN

## 2015-12-13 MED ORDER — ARTIFICIAL TEARS OP OINT
TOPICAL_OINTMENT | OPHTHALMIC | Status: DC | PRN
  Administered 2015-12-13: 1 via OPHTHALMIC

## 2015-12-13 MED ORDER — OXYCODONE-ACETAMINOPHEN 5-325 MG PO TABS: 1.0000 | ORAL_TABLET | ORAL | 0 refills | Status: DC | PRN

## 2015-12-13 MED ORDER — FENTANYL CITRATE (PF) 100 MCG/2ML IJ SOLN
INTRAMUSCULAR | Status: DC | PRN
  Administered 2015-12-13 (×2): 50 ug via INTRAVENOUS
  Administered 2015-12-13: 100 ug via INTRAVENOUS

## 2015-12-13 MED ORDER — ACETAMINOPHEN 650 MG RE SUPP: 650.0000 mg | RECTAL | Status: DC | PRN

## 2015-12-13 MED ORDER — BUPIVACAINE HCL (PF) 0.25 % IJ SOLN
INTRAMUSCULAR | Status: AC
  Filled 2015-12-13: qty 10

## 2015-12-13 MED ORDER — SUGAMMADEX SODIUM 200 MG/2ML IV SOLN
INTRAVENOUS | Status: DC | PRN
  Administered 2015-12-13: 200 mg via INTRAVENOUS

## 2015-12-13 MED ORDER — HYDROMORPHONE HCL 1 MG/ML IJ SOLN
0.2500 mg | INTRAMUSCULAR | Status: DC | PRN
  Administered 2015-12-13 (×2): 0.5 mg via INTRAVENOUS

## 2015-12-13 MED ORDER — KETAMINE HCL 10 MG/ML IJ SOLN
INTRAMUSCULAR | Status: DC | PRN
  Administered 2015-12-13 (×2): 20 mg via INTRAVENOUS
  Administered 2015-12-13: 60 mg via INTRAVENOUS

## 2015-12-13 MED ORDER — 0.9 % SODIUM CHLORIDE (POUR BTL) OPTIME
TOPICAL | Status: DC | PRN
  Administered 2015-12-13: 1000 mL

## 2015-12-13 MED ORDER — MORPHINE SULFATE (PF) 2 MG/ML IV SOLN: 2.0000 mg | INTRAVENOUS | Status: DC | PRN

## 2015-12-13 MED ORDER — ACETAMINOPHEN 10 MG/ML IV SOLN
INTRAVENOUS | Status: AC
  Filled 2015-12-13: qty 100

## 2015-12-13 MED ORDER — OXYCODONE HCL 5 MG/5ML PO SOLN: 5.0000 mg | Freq: Once | ORAL | Status: DC | PRN

## 2015-12-13 MED ORDER — OXYCODONE HCL 5 MG PO TABS
ORAL_TABLET | ORAL | Status: DC
Start: 2015-12-13 — End: 2015-12-13
  Filled 2015-12-13: qty 2

## 2015-12-13 MED ORDER — ACETAMINOPHEN 325 MG PO TABS: 650.0000 mg | ORAL_TABLET | ORAL | Status: DC | PRN

## 2015-12-13 MED ORDER — HYDROMORPHONE HCL 1 MG/ML IJ SOLN
INTRAMUSCULAR | Status: DC | PRN
  Administered 2015-12-13: 1 mg via INTRAVENOUS

## 2015-12-13 MED ORDER — LACTATED RINGERS IV SOLN: INTRAVENOUS | Status: DC

## 2015-12-13 MED ORDER — LACTATED RINGERS IV SOLN
INTRAVENOUS | Status: DC | PRN
  Administered 2015-12-13 (×2): via INTRAVENOUS

## 2015-12-13 MED ORDER — LIDOCAINE 2% (20 MG/ML) 5 ML SYRINGE
INTRAMUSCULAR | Status: AC
  Filled 2015-12-13: qty 5

## 2015-12-13 MED ORDER — PROPOFOL 10 MG/ML IV BOLUS
INTRAVENOUS | Status: DC | PRN
  Administered 2015-12-13: 200 mg via INTRAVENOUS

## 2015-12-13 MED ORDER — MIDAZOLAM HCL 5 MG/5ML IJ SOLN
INTRAMUSCULAR | Status: DC | PRN
  Administered 2015-12-13 (×2): 2 mg via INTRAVENOUS

## 2015-12-13 MED ORDER — KETAMINE HCL-SODIUM CHLORIDE 100-0.9 MG/10ML-% IV SOSY
PREFILLED_SYRINGE | INTRAVENOUS | Status: AC
  Filled 2015-12-13: qty 10

## 2015-12-13 MED ORDER — SODIUM CHLORIDE 0.9% FLUSH: 3.0000 mL | INTRAVENOUS | Status: DC | PRN

## 2015-12-13 MED ORDER — DEXTROSE 5 % IV SOLN
INTRAVENOUS | Status: DC | PRN
  Administered 2015-12-13: 12:00:00 via INTRAVENOUS

## 2015-12-13 MED ORDER — MIDAZOLAM HCL 2 MG/2ML IJ SOLN
INTRAMUSCULAR | Status: AC
  Filled 2015-12-13: qty 2

## 2015-12-13 MED ORDER — ROCURONIUM BROMIDE 100 MG/10ML IV SOLN
INTRAVENOUS | Status: DC | PRN
  Administered 2015-12-13: 60 mg via INTRAVENOUS

## 2015-12-13 MED ORDER — SODIUM CHLORIDE 0.9% FLUSH: 3.0000 mL | Freq: Two times a day (BID) | INTRAVENOUS | Status: DC

## 2015-12-13 SURGICAL SUPPLY — 49 items
APPLIER CLIP 5 13 M/L LIGAMAX5 (MISCELLANEOUS)
BENZOIN TINCTURE PRP APPL 2/3 (GAUZE/BANDAGES/DRESSINGS) ×3 IMPLANT
CANISTER SUCTION 2500CC (MISCELLANEOUS) IMPLANT
CHLORAPREP W/TINT 26ML (MISCELLANEOUS) ×3 IMPLANT
CLIP APPLIE 5 13 M/L LIGAMAX5 (MISCELLANEOUS) IMPLANT
CLOSURE WOUND 1/2 X4 (GAUZE/BANDAGES/DRESSINGS) ×1
COVER SURGICAL LIGHT HANDLE (MISCELLANEOUS) ×3 IMPLANT
DISSECTOR BLUNT TIP ENDO 5MM (MISCELLANEOUS) IMPLANT
ELECT REM PT RETURN 9FT ADLT (ELECTROSURGICAL) ×3
ELECTRODE REM PT RTRN 9FT ADLT (ELECTROSURGICAL) ×1 IMPLANT
GAUZE SPONGE 2X2 8PLY STRL LF (GAUZE/BANDAGES/DRESSINGS) ×1 IMPLANT
GLOVE BIO SURGEON STRL SZ 6.5 (GLOVE) ×2 IMPLANT
GLOVE BIO SURGEON STRL SZ7.5 (GLOVE) ×3 IMPLANT
GLOVE BIO SURGEONS STRL SZ 6.5 (GLOVE) ×1
GLOVE BIOGEL PI IND STRL 6 (GLOVE) ×1 IMPLANT
GLOVE BIOGEL PI IND STRL 6.5 (GLOVE) ×2 IMPLANT
GLOVE BIOGEL PI INDICATOR 6 (GLOVE) ×2
GLOVE BIOGEL PI INDICATOR 6.5 (GLOVE) ×4
GLOVE SURG SS PI 6.5 STRL IVOR (GLOVE) ×3 IMPLANT
GOWN STRL REUS W/ TWL LRG LVL3 (GOWN DISPOSABLE) ×2 IMPLANT
GOWN STRL REUS W/ TWL XL LVL3 (GOWN DISPOSABLE) ×1 IMPLANT
GOWN STRL REUS W/TWL LRG LVL3 (GOWN DISPOSABLE) ×4
GOWN STRL REUS W/TWL XL LVL3 (GOWN DISPOSABLE) ×2
KIT BASIN OR (CUSTOM PROCEDURE TRAY) ×3 IMPLANT
KIT ROOM TURNOVER OR (KITS) ×3 IMPLANT
MESH 3DMAX 4X6 LT LRG (Mesh General) ×3 IMPLANT
NEEDLE INSUFFLATION 14GA 120MM (NEEDLE) IMPLANT
NS IRRIG 1000ML POUR BTL (IV SOLUTION) ×3 IMPLANT
PAD ARMBOARD 7.5X6 YLW CONV (MISCELLANEOUS) ×6 IMPLANT
RELOAD STAPLE HERNIA 4.0 BLUE (INSTRUMENTS) ×6 IMPLANT
RELOAD STAPLE HERNIA 4.8 BLK (STAPLE) IMPLANT
SCISSORS LAP 5X35 DISP (ENDOMECHANICALS) ×3 IMPLANT
SET IRRIG TUBING LAPAROSCOPIC (IRRIGATION / IRRIGATOR) IMPLANT
SET TROCAR LAP APPLE-HUNT 5MM (ENDOMECHANICALS) ×3 IMPLANT
SPONGE GAUZE 2X2 STER 10/PKG (GAUZE/BANDAGES/DRESSINGS) ×2
STAPLER HERNIA 12 8.5 360D (INSTRUMENTS) ×6 IMPLANT
STRIP CLOSURE SKIN 1/2X4 (GAUZE/BANDAGES/DRESSINGS) ×2 IMPLANT
SUT MNCRL AB 4-0 PS2 18 (SUTURE) ×3 IMPLANT
SUT VIC AB 1 CT1 27 (SUTURE)
SUT VIC AB 1 CT1 27XBRD ANBCTR (SUTURE) IMPLANT
SYRINGE TOOMEY DISP (SYRINGE) ×3 IMPLANT
TAPE CLOTH SURG 4X10 WHT LF (GAUZE/BANDAGES/DRESSINGS) ×3 IMPLANT
TOWEL OR 17X24 6PK STRL BLUE (TOWEL DISPOSABLE) ×3 IMPLANT
TOWEL OR 17X26 10 PK STRL BLUE (TOWEL DISPOSABLE) ×3 IMPLANT
TRAY FOLEY CATH SILVER 14FR (SET/KITS/TRAYS/PACK) ×3 IMPLANT
TRAY LAPAROSCOPIC MC (CUSTOM PROCEDURE TRAY) ×3 IMPLANT
TROCAR XCEL 12X100 BLDLESS (ENDOMECHANICALS) ×3 IMPLANT
TUBING INSUFFLATION (TUBING) ×3 IMPLANT
WATER STERILE IRR 1000ML POUR (IV SOLUTION) ×3 IMPLANT

## 2015-12-13 NOTE — Interval H&P Note (Signed)
History and Physical Interval Note:  12/13/2015 9:53 AM  Aaron Davidson  has presented today for surgery, with the diagnosis of left inguinal hernia  The various methods of treatment have been discussed with the patient and family. After consideration of risks, benefits and other options for treatment, the patient has consented to  Procedure(s): LAPAROSCOPIC LEFT  INGUINAL HERNIA (Left) INSERTION OF MESH (Left) as a surgical intervention .  The patient's history has been reviewed, patient examined, no change in status, stable for surgery.  I have reviewed the patient's chart and labs.  Questions were answered to the patient's satisfaction.     Rosario Jacks., Anne Hahn

## 2015-12-13 NOTE — H&P (View-Only) (Signed)
History of Present Illness Ralene Ok MD; 11/17/2015 11:05 AM) Patient words: New Pt Aaron Davidson.  The patient is a 45 year old male who presents with an inguinal hernia. The patient is a 45 year old male who is referred by Philis Fendt, PA-C for an evaluation of a left inguinal hernia. Patient states that this there for several weeks. He states that it causes some acute pain. He states he is able to reduce it with some effort. 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  Patient has had no signs or symptoms of incarceration or strangulation.  Patient is a 2 pack per day smoker.   Allergies Sander Nephew, CMA; 11/17/2015 10:57 AM) No Known Drug Allergies08/16/2017  Medication History Sander Nephew, CMA; 11/17/2015 10:59 AM) Acetaminophen (500MG  Tablet, Oral) Active. Naproxen (500MG  Tablet, Oral) Active. Medications Reconciled    Review of Systems Ralene Ok, MD; 11/17/2015 11:6 AM) General Not Present- Fever. Respiratory Not Present- Cough and Difficulty Breathing. Cardiovascular Not Present- Chest Pain. Musculoskeletal Not Present- Myalgia. Neurological Not Present- Weakness.  Vitals Georgia Lopes Albanese CMA; 11/17/2015 10:56 AM) 11/17/2015 10:55 AM Weight: 185 lb Height: 71in Body Surface Area: 2.04 m Body Mass Index: 25.8 kg/m  Temp.: 98.38F(Oral)  Pulse: 60 (Regular)  Resp.: 18 (Unlabored)  BP: 128/62 (Sitting, Left Arm, Standard)       Physical Exam Ralene Ok, MD; 11/17/2015 11:6 AM) General Mental Status-Alert. General Appearance-Consistent with stated age. Hydration-Well hydrated. Voice-Normal.  Head and Neck Head-normocephalic, atraumatic with no lesions or palpable masses. Trachea-midline.  Eye Eyeball - Bilateral-Extraocular movements intact. Sclera/Conjunctiva - Bilateral-No scleral icterus.  Chest and Lung Exam Chest and lung exam reveals -quiet, even and  easy respiratory effort with no use of accessory muscles. Inspection Chest Wall - Normal. Back - normal.  Cardiovascular Cardiovascular examination reveals -normal heart sounds, regular rate and rhythm with no murmurs.  Abdomen Inspection Skin - Scar - no surgical scars. Hernias - Inguinal hernia - Left - Reducible. Palpation/Percussion Normal exam - Soft, Non Tender, No Rebound tenderness, No Rigidity (guarding) and No hepatosplenomegaly. Auscultation Normal exam - Bowel sounds normal.  Neurologic Neurologic evaluation reveals -alert and oriented x 3 with no impairment of recent or remote memory. Mental Status-Normal.  Musculoskeletal Normal Exam - Left-Upper Extremity Strength Normal and Lower Extremity Strength Normal. Normal Exam - Right-Upper Extremity Strength Normal, Lower Extremity Weakness.    Assessment & Plan Ralene Ok MD; 11/17/2015 11:06 AM) LEFT INGUINAL HERNIA (K40.90) Impression: 45 year old male with a left inguinal hernia.  1. The patient will like to proceed to the operating room for laparoscopic left inguinal hernia repair with mesh.  2. I discussed with the patient the signs and symptoms of incarceration and strangulation and the need to proceed to the ER should they occur.  3. I discussed with the patient the risks and benefits of the procedure to include but not limited to: Infection, bleeding, damage to surrounding structures, possible need for further surgery, possible nerve pain, and possible recurrence. The patient was understanding and wishes to proceed.

## 2015-12-13 NOTE — Anesthesia Procedure Notes (Signed)
Procedure Name: Intubation Date/Time: 12/13/2015 12:27 PM Performed by: Jacquiline Doe A Pre-anesthesia Checklist: Patient identified, Emergency Drugs available, Suction available and Patient being monitored Patient Re-evaluated:Patient Re-evaluated prior to inductionOxygen Delivery Method: Circle System Utilized Preoxygenation: Pre-oxygenation with 100% oxygen Intubation Type: IV induction and Cricoid Pressure applied Ventilation: Mask ventilation without difficulty Laryngoscope Size: Mac and 4 Grade View: Grade I Tube type: Oral Tube size: 7.5 mm Number of attempts: 1 Airway Equipment and Method: Stylet and Oral airway Placement Confirmation: ETT inserted through vocal cords under direct vision,  positive ETCO2 and breath sounds checked- equal and bilateral Secured at: 23 cm Tube secured with: Tape Dental Injury: Teeth and Oropharynx as per pre-operative assessment

## 2015-12-13 NOTE — Anesthesia Preprocedure Evaluation (Signed)
Anesthesia Evaluation  Patient identified by MRN, date of birth, ID band Patient awake    Reviewed: Allergy & Precautions, NPO status , Patient's Chart, lab work & pertinent test results  Airway Mallampati: I  TM Distance: >3 FB Neck ROM: Full    Dental  (+) Upper Dentures, Teeth Intact   Pulmonary neg shortness of breath, neg COPD, Current Smoker,    breath sounds clear to auscultation       Cardiovascular negative cardio ROS   Rhythm:Regular     Neuro/Psych negative neurological ROS  negative psych ROS   GI/Hepatic negative GI ROS, Neg liver ROS,   Endo/Other  negative endocrine ROS  Renal/GU negative Renal ROS     Musculoskeletal   Abdominal   Peds  Hematology   Anesthesia Other Findings   Reproductive/Obstetrics                             Anesthesia Physical Anesthesia Plan  ASA: II  Anesthesia Plan: General   Post-op Pain Management:    Induction: Intravenous  Airway Management Planned:   Additional Equipment: None  Intra-op Plan:   Post-operative Plan: Extubation in OR  Informed Consent: I have reviewed the patients History and Physical, chart, labs and discussed the procedure including the risks, benefits and alternatives for the proposed anesthesia with the patient or authorized representative who has indicated his/her understanding and acceptance.   Dental advisory given  Plan Discussed with: CRNA and Surgeon  Anesthesia Plan Comments:         Anesthesia Quick Evaluation

## 2015-12-13 NOTE — Interval H&P Note (Signed)
History and Physical Interval Note:  12/13/2015 11:48 AM  Aaron Davidson  has presented today for surgery, with the diagnosis of left inguinal hernia  The various methods of treatment have been discussed with the patient and family. After consideration of risks, benefits and other options for treatment, the patient has consented to  Procedure(s): LAPAROSCOPIC LEFT  INGUINAL HERNIA (Left) INSERTION OF MESH (Left) as a surgical intervention .  The patient's history has been reviewed, patient examined, no change in status, stable for surgery.  I have reviewed the patient's chart and labs.  Questions were answered to the patient's satisfaction.     Rosario Jacks., Anne Hahn

## 2015-12-13 NOTE — Transfer of Care (Signed)
Immediate Anesthesia Transfer of Care Note  Patient: Aaron Davidson  Procedure(s) Performed: Procedure(s): LAPAROSCOPIC LEFT  INGUINAL HERNIA (Left) INSERTION OF MESH (Left)  Patient Location: PACU  Anesthesia Type:General  Level of Consciousness: sedated, patient cooperative and responds to stimulation  Airway & Oxygen Therapy: Patient Spontanous Breathing and Patient connected to nasal cannula oxygen  Post-op Assessment: Report given to RN and Post -op Vital signs reviewed and stable  Post vital signs: Reviewed and stable  Last Vitals:  Vitals:   12/13/15 1106 12/13/15 1326  BP: 131/69 127/81  Pulse: 75 73  Resp: 18 16  Temp: 36.7 C 36.2 C    Last Pain:  Vitals:   12/13/15 1111  TempSrc:   PainSc: 9          Complications: No apparent anesthesia complications

## 2015-12-13 NOTE — Discharge Instructions (Signed)
[facility] _______[facility] ° °INGUINAL HERNIA REPAIR: POST OP INSTRUCTIONS ° °Always review your discharge instruction sheet given to you by the facility where your surgery was performed. °IF YOU HAVE DISABILITY OR FAMILY LEAVE FORMS, YOU MUST BRING THEM TO THE OFFICE FOR PROCESSING.   °DO NOT GIVE THEM TO YOUR DOCTOR. ° °1. A  prescription for pain medication may be given to you upon discharge.  Take your pain medication as prescribed, if needed.  If narcotic pain medicine is not needed, then you may take acetaminophen (Tylenol) or ibuprofen (Advil) as needed. °2. Take your usually prescribed medications unless otherwise directed. °3. If you need a refill on your pain medication, please contact your pharmacy.  They will contact our office to request authorization. Prescriptions will not be filled after 5 pm or on week-ends. °4. You should follow a light diet the first 24 hours after arrival home, such as soup and crackers, etc.  Be sure to include lots of fluids daily.  Resume your normal diet the day after surgery. °5. Most patients will experience some swelling and bruising around the umbilicus or in the groin and scrotum.  Ice packs and reclining will help.  Swelling and bruising can take several days to resolve.  °6. It is common to experience some constipation if taking pain medication after surgery.  Increasing fluid intake and taking a stool softener (such as Colace) will usually help or prevent this problem from occurring.  A mild laxative (Milk of Magnesia or Miralax) should be taken according to package directions if there are no bowel movements after 48 hours. °7. Unless discharge instructions indicate otherwise, you may remove your bandages 24-48 hours after surgery, and you may shower at that time.  You may have steri-strips (small skin tapes) in place directly over the incision.  These strips should be left on the skin for 7-10 days.  If your surgeon used skin glue on the incision, you  may shower in 24 hours.  The glue will flake off over the next 2-3 weeks.  Any sutures or staples will be removed at the office during your follow-up visit. °8. ACTIVITIES:  You may resume regular (light) daily activities beginning the next day--such as daily self-care, walking, climbing stairs--gradually increasing activities as tolerated.  You may have sexual intercourse when it is comfortable.  Refrain from any heavy lifting or straining until approved by your doctor. °a. You may drive when you are no longer taking prescription pain medication, you can comfortably wear a seatbelt, and you can safely maneuver your car and apply brakes. °b. RETURN TO WORK:  __________________________________________________________ °9. You should see your doctor in the office for a follow-up appointment approximately 2-3 weeks after your surgery.  Make sure that you call for this appointment within a day or two after you arrive home to insure a convenient appointment time. °10. OTHER INSTRUCTIONS:  __________________________________________________________________________________________________________________________________________________________________________________________  °WHEN TO CALL YOUR DOCTOR: °1. Fever over 101.0 °2. Inability to urinate °3. Nausea and/or vomiting °4. Extreme swelling or bruising °5. Continued bleeding from incision. °6. Increased pain, redness, or drainage from the incision ° °The clinic staff is available to answer your questions during regular business hours.  Please don’t hesitate to call and ask to speak to one of the nurses for clinical concerns.  If you have a medical emergency, go to the nearest emergency room or call 911.  A surgeon from [facility] is always on call at the hospital ° ° °[address], [address], [place], [place]  [address] ? ° [address] [address], [place], [place]   [address] °[phone] ? [phone] ? FAX [phone] °Web site:  [email or web address] ° °

## 2015-12-13 NOTE — Op Note (Signed)
12/13/2015  1:13 PM  PATIENT:  Aaron Davidson  45 y.o. male  PRE-OPERATIVE DIAGNOSIS:  left inguinal hernia  POST-OPERATIVE DIAGNOSIS:  left indirect inguinal hernia  PROCEDURE:  Procedure(s): LAPAROSCOPIC LEFT  INGUINAL HERNIA (Left) INSERTION OF MESH (Left)  SURGEON:  Surgeon(s) and Role:    * Ralene Ok, MD - Primary   ANESTHESIA:   local and general  EBL:  Total I/O In: 1200 [I.V.:1200] Out: 5 [Blood:5]  BLOOD ADMINISTERED:none  DRAINS: none   LOCAL MEDICATIONS USED:  BUPIVICAINE   SPECIMEN:  No Specimen  DISPOSITION OF SPECIMEN:  N/A  COUNTS:  YES  TOURNIQUET:  * No tourniquets in log *  DICTATION: .Dragon Dictation   Counts: reported as correct x 2  Findings:  The patient had a moderate sized left indirect inguinal hernia  Indications for procedure:  The patient is a 45 year old male with a left hernia for several months. Patient complained of symptomatology to his left inguinal area. The patient was taken back for elective inguinal hernia repair.  Details of the procedure: The patient was taken back to the operating room. The patient was placed in supine position with bilateral SCDs in place.  The patient was prepped and draped in the usual sterile fashion.  After appropriate anitbiotics were confirmed, a time-out was confirmed and all facts were verified.  0.25% Marcaine was used to infiltrate the umbilical area. A 11-blade was used to cut down the skin and blunt dissection was used to get the anterior fashion.  The anterior fascia was incised approximately 1 cm and the muscles were retracted laterally. Blunt dissection was then used to create a space in the preperitoneal area. At this time a 10 mm camera was then introduced into the space and advanced the pubic tubercle and a 12 mm trocar was placed over this and insufflation was started.  At this time and space was created from medial to laterally the preperitoneal space.  Cooper's ligament was  initially cleaned off.  The hernia sac was identified in the indirect space. Dissection of the hernia sac was undertaken the vas deferens was identified and protected in all parts of the case.  Once the hernia sac was taken down to approximately the umbilicus a Bard 3D Max mesh, size: Large, was  introduced into the preperitoneal space.  The mesh was brought over to cover the direct and indirect hernia spaces.  This was anchored into place and secured to Cooper's ligament with 4.47mm staples from a Coviden hernia stapler. It was anchored to the anterior abdominal wall with 4.8 mm staples. The hernia sac was seen lying posterior to the mesh. There was no staples placed laterally. The insufflation was evacuated and the peritoneum was seen posterior to the mesh. The trochars were removed. The anterior fascia was reapproximated using #1 Vicryl on a UR- 6.  Intra-abdominal air was evacuated and the Veress needle removed. The skin was reapproximated using 4-0 Monocryl subcuticular fashion the patient was awakened from general anesthesia and taken to recovery in stable condition.   PLAN OF CARE: Discharge to home after PACU  PATIENT DISPOSITION:  PACU - hemodynamically stable.   Delay start of Pharmacological VTE agent (>24hrs) due to surgical blood loss or risk of bleeding: not applicable

## 2015-12-14 ENCOUNTER — Encounter (HOSPITAL_COMMUNITY): Payer: Self-pay | Admitting: General Surgery

## 2015-12-15 NOTE — Anesthesia Postprocedure Evaluation (Signed)
Anesthesia Post Note  Patient: Aaron Davidson  Procedure(s) Performed: Procedure(s) (LRB): LAPAROSCOPIC LEFT  INGUINAL HERNIA (Left) INSERTION OF MESH (Left)  Patient location during evaluation: PACU Anesthesia Type: General Level of consciousness: awake Pain management: pain level controlled Vital Signs Assessment: post-procedure vital signs reviewed and stable Respiratory status: spontaneous breathing Cardiovascular status: stable Postop Assessment: no signs of nausea or vomiting Anesthetic complications: no    Last Vitals:  Vitals:   12/13/15 1440 12/13/15 1500  BP: 121/80 140/89  Pulse: 70   Resp: 14 16  Temp:  36.3 C    Last Pain:  Vitals:   12/13/15 1430  TempSrc:   PainSc: Asleep                 Alexxia Stankiewicz

## 2018-09-30 ENCOUNTER — Other Ambulatory Visit: Payer: Self-pay

## 2018-09-30 ENCOUNTER — Encounter: Payer: Self-pay | Admitting: Registered Nurse

## 2018-09-30 ENCOUNTER — Ambulatory Visit: Payer: Self-pay | Admitting: Registered Nurse

## 2018-09-30 VITALS — BP 120/75 | HR 79 | Temp 98.5°F | Resp 16 | Wt 181.0 lb

## 2018-09-30 DIAGNOSIS — K649 Unspecified hemorrhoids: Secondary | ICD-10-CM

## 2018-09-30 DIAGNOSIS — K602 Anal fissure, unspecified: Secondary | ICD-10-CM

## 2018-09-30 MED ORDER — LIDOCAINE-HYDROCORT (PERIANAL) 3-0.5 % EX CREA: 1.0000 "application " | TOPICAL_CREAM | Freq: Two times a day (BID) | CUTANEOUS | 1 refills | Status: DC

## 2018-09-30 NOTE — Patient Instructions (Signed)
° ° ° °  If you have lab work done today you will be contacted with your lab results within the next 2 weeks.  If you have not heard from us then please contact us. The fastest way to get your results is to register for My Chart. ° ° °IF you received an x-ray today, you will receive an invoice from [facility]. Please contact [facility] at [phone] with questions or concerns regarding your invoice.  ° °IF you received labwork today, you will receive an invoice from [facility]. Please contact [facility] at [phone] with questions or concerns regarding your invoice.  ° °Our billing staff will not be able to assist you with questions regarding bills from these companies. ° °You will be contacted with the lab results as soon as they are available. The fastest way to get your results is to activate your My Chart account. Instructions are located on the last page of this paperwork. If you have not heard from us regarding the results in 2 weeks, please contact this office. °  ° ° ° °

## 2018-09-30 NOTE — Progress Notes (Signed)
Established Patient Office Visit  Subjective:  Patient ID: Aaron Davidson, male    DOB: 07-Nov-1970  Age: 48 y.o. MRN: [id number]  CC:  Chief Complaint  Patient presents with  . Hemorrhoids    pt states he has a severe case of hemorrhoids that has been very painful    HPI DYER KLUG presents for evaluation of hemorrhoids.   He states that about a year and a half prior to today's visit, he had an inguinal hernia for which he received a surgical repair with mesh. Since then, he states that he has had an increase in constipation and has noticed hemorrhoids. They were formerly easily reduced within 1-2 hours on their own - however, at this point, they often take up to a full day to reduce.    He has tried Preparation H and Rectaid. They have had mixed effect - but have not overall been a solution for him.   At this time, he states that these interfere with his work. He is unable to walk or stay on his feet due to pain and discomfort. Additionally, he states that walking further irritates his rectum.   He denies change in bowel habits. He states he has bright red blood in stool on occasion. He states that he has not had a family history of colorectal cancer. He states that he has not been eating lately in efforts to avoid voiding his bowels - he has lost 10+ lbs in recent weeks.   History reviewed. No pertinent past medical history.  Past Surgical History:  Procedure Laterality Date  . DENTAL SURGERY    . INGUINAL HERNIA REPAIR Left 12/13/2015   Procedure: LAPAROSCOPIC LEFT  INGUINAL HERNIA;  Surgeon: Ralene Ok, MD;  Location: [facility];  Service: General;  Laterality: Left;  . INSERTION OF MESH Left 12/13/2015   Procedure: INSERTION OF MESH;  Surgeon: Ralene Ok, MD;  Location: [facility];  Service: General;  Laterality: Left;  . KNEE SURGERY Right   . PILONIDAL CYST EXCISION      Family History  Problem Relation Age of Onset  . Heart disease Father     Social History    Socioeconomic History  . Marital status: Divorced    Spouse name: Not on file  . Number of children: Not on file  . Years of education: Not on file  . Highest education level: Not on file  Occupational History  . Not on file  Social Needs  . Financial resource strain: Not on file  . Food insecurity    Worry: Not on file    Inability: Not on file  . Transportation needs    Medical: Not on file    Non-medical: Not on file  Tobacco Use  . Smoking status: Current Every Day Smoker    Packs/day: 2.00    Years: 38.00    Pack years: 76.00    Types: Cigarettes  . Smokeless tobacco: Never Used  Substance and Sexual Activity  . Alcohol use: Yes    Alcohol/week: 1.0 standard drinks    Types: 1 Standard drinks or equivalent per week    Comment: rare  . Drug use: No  . Sexual activity: Not on file  Lifestyle  . Physical activity    Days per week: Not on file    Minutes per session: Not on file  . Stress: Not on file  Relationships  . Social connections    Talks on phone: Not on file  Gets together: Not on file    Attends religious service: Not on file    Active member of club or organization: Not on file    Attends meetings of clubs or organizations: Not on file    Relationship status: Not on file  . Intimate partner violence    Fear of current or ex partner: Not on file    Emotionally abused: Not on file    Physically abused: Not on file    Forced sexual activity: Not on file  Other Topics Concern  . Not on file  Social History Narrative  . Not on file    Outpatient Medications Prior to Visit  Medication Sig Dispense Refill  . acetaminophen (TYLENOL) 500 MG tablet Take 1,000 mg by mouth every 6 (six) hours as needed for mild pain.    . naproxen sodium (ANAPROX) 220 MG tablet Take 440 mg by mouth 2 (two) times daily as needed (for pain).     Marland Kitchen oxyCODONE-acetaminophen (ROXICET) 5-325 MG tablet Take 1-2 tablets by mouth every 4 (four) hours as needed. 30 tablet 0    No facility-administered medications prior to visit.     Allergies  Allergen Reactions  . No Known Allergies     ROS Review of Systems  Constitutional: Positive for activity change and appetite change. Negative for fever and unexpected weight change.  HENT: Negative.   Eyes: Negative.   Respiratory: Negative.   Cardiovascular: Negative.   Gastrointestinal: Positive for anal bleeding, blood in stool, constipation and rectal pain. Negative for abdominal distention, abdominal pain, diarrhea, nausea and vomiting.  Endocrine: Negative.   Genitourinary: Negative.   Musculoskeletal: Negative.   Skin: Negative.   Allergic/Immunologic: Negative.   Neurological: Negative.   Hematological: Negative.   Psychiatric/Behavioral: Negative.       Objective:    Physical Exam  Constitutional: He appears well-developed and well-nourished. He appears distressed.  Cardiovascular: Normal rate and regular rhythm.  Pulmonary/Chest: Effort normal. No respiratory distress.  Abdominal: Soft. Bowel sounds are normal. He exhibits no distension and no mass. There is no abdominal tenderness. There is no rebound and no guarding.  Genitourinary:    Genitourinary Comments: Two anal fissures noted at 8 oclock and 4 o clock. Not actively bleeding. Pt states these are areas of worst pain/inflammation.  No hemorrhoids visible at this time. Pt states that they reduced easily this morning following BM.  Rectum has a number of internal hemorrhoids.    Skin: Skin is warm and dry. No rash noted. He is not diaphoretic. No erythema. No pallor.  Psychiatric: He has a normal mood and affect. His behavior is normal. Judgment and thought content normal.  Nursing note and vitals reviewed.   BP 120/75   Pulse 79   Temp 98.5 F (36.9 C) (Oral)   Resp 16   Wt 181 lb (82.1 kg)   SpO2 97%   BMI 25.24 kg/m  Wt Readings from Last 3 Encounters:  09/30/18 181 lb (82.1 kg)  12/13/15 184 lb 4.8 oz (83.6 kg)  12/10/15 184  lb 4.8 oz (83.6 kg)     Health Maintenance Due  Topic Date Due  . HIV Screening  08/31/1983  . TETANUS/TDAP  08/31/1987  . COLONOSCOPY  08/31/2018    There are no preventive care reminders to display for this patient.  No results found for: TSH Lab Results  Component Value Date   WBC 11.5 (H) 12/10/2015   HGB 16.4 12/10/2015   HCT 48.5 12/10/2015  MCV 88.8 12/10/2015   PLT 158 12/10/2015   Lab Results  Component Value Date   NA 135 05/06/2009   K 4.1 05/06/2009   CO2 27 05/06/2009   GLUCOSE 105 (H) 05/06/2009   BUN 6 05/06/2009   CREATININE 0.97 05/06/2009   CALCIUM 8.8 05/06/2009   No results found for: CHOL No results found for: HDL No results found for: LDLCALC No results found for: TRIG No results found for: CHOLHDL No results found for: HGBA1C    Assessment & Plan:   Problem List Items Addressed This Visit    None    Visit Diagnoses    Anal fissure    -  Primary   Relevant Medications   Lidocaine-Hydrocortisone Ace (LIDOCAINE-HYDROCORT, PERIANAL,) 3-0.5 % CREA      Meds ordered this encounter  Medications  . Lidocaine-Hydrocortisone Ace (LIDOCAINE-HYDROCORT, PERIANAL,) 3-0.5 % CREA    Sig: Apply 1 application topically 2 (two) times a day.    Dispense:  28.3 g    Refill:  1    Order Specific Question:   Supervising Provider    Answer:   Forrest Moron [id number]    Follow-up: No follow-ups on file.   PLAN:  Start on hydrocortisone/lidocaine cream twice daily as needed for rectal pain/inflammation.   Start on psyllium husk daily.   Continue Preparation H.  Reviewed nonpharm - drink plenty of water, more than you think you need. It should be at least 1/2 of your body weight in ounces (I.e., if you weigh 181 lbs, drink at least 90oz of water daily). Eat foods rich and fiber and nutrient dense - beans, legumes, leafy greens are all ideal for this. Do not spend a long time on the toilet or force stool - sit when you are ready and have to go.   Referred to GI for internal hemorrhoids - pt may require surgical intervention. He does not currently have insurance, and states that he is in a precarious financial situation - he may delay the GI referral if we can find symptomatic relief.   Pt will return to clinic with insurance to establish care, CPE, labs  Patient encouraged to call clinic with any questions, comments, or concerns.   Maximiano Coss, NP

## 2018-10-01 ENCOUNTER — Encounter: Payer: Self-pay | Admitting: Registered Nurse

## 2018-10-12 ENCOUNTER — Encounter: Payer: Self-pay | Admitting: Registered Nurse

## 2018-10-13 ENCOUNTER — Other Ambulatory Visit: Payer: Self-pay | Admitting: Registered Nurse

## 2018-10-13 DIAGNOSIS — K602 Anal fissure, unspecified: Secondary | ICD-10-CM

## 2018-10-14 ENCOUNTER — Other Ambulatory Visit: Payer: Self-pay | Admitting: Registered Nurse

## 2018-10-14 DIAGNOSIS — K602 Anal fissure, unspecified: Secondary | ICD-10-CM

## 2018-10-14 MED ORDER — LIDOCAINE-HYDROCORT (PERIANAL) 3-0.5 % EX CREA: 1.0000 "application " | TOPICAL_CREAM | Freq: Two times a day (BID) | CUTANEOUS | 1 refills | Status: DC

## 2018-10-14 NOTE — Progress Notes (Signed)
Refill sent  Kathrin Ruddy, NP

## 2018-11-12 ENCOUNTER — Other Ambulatory Visit: Payer: Self-pay | Admitting: Registered Nurse

## 2018-11-12 DIAGNOSIS — K602 Anal fissure, unspecified: Secondary | ICD-10-CM

## 2018-11-12 NOTE — Telephone Encounter (Signed)
Requested medications are due for refill today?  No  Requested medications are on the active medication list?  Yes  Last refill - 10/14/2018  Future visit scheduled?  No  Notes to clinic:  Requested Prescriptions  Pending Prescriptions Disp Refills   Lidocaine-Hydrocortisone Ace (LIDOCAINE-HYDROCORT, PERIANAL,) 3-0.5 % CREA [Pharmacy Med Name: LIDOCAINE 3%/HC 0.5% CREAM 28.35GM] 28.3 g 1    Sig: APPLY EXTERNALLY TO THE AFFECTED AREA TWICE DAILY     There is no refill protocol information for this order

## 2018-11-12 NOTE — Telephone Encounter (Signed)
Do you think it is too soon for a refill of this? It was last sent on 10/14/18

## 2018-12-05 ENCOUNTER — Other Ambulatory Visit: Payer: Self-pay | Admitting: Registered Nurse

## 2018-12-05 DIAGNOSIS — K602 Anal fissure, unspecified: Secondary | ICD-10-CM

## 2018-12-05 NOTE — Telephone Encounter (Signed)
Requested medication (s) are due for refill today: yes  Requested medication (s) are on the active medication list: yes  Last refill:  11/12/18  Future visit scheduled: no  Notes to clinic:  Review for refill   Requested Prescriptions  Pending Prescriptions Disp Refills   Lidocaine-Hydrocortisone Ace (LIDOCAINE-HYDROCORT, PERIANAL,) 3-0.5 % CREA [Pharmacy Med Name: LIDOCAINE 3%/HC 0.5% CREAM 28.35GM] 28.3 g 1    Sig: APPLY EXTERNALLY TO THE AFFECTED AREA TWICE DAILY     There is no refill protocol information for this order

## 2018-12-26 ENCOUNTER — Other Ambulatory Visit: Payer: Self-pay | Admitting: Registered Nurse

## 2018-12-26 DIAGNOSIS — K602 Anal fissure, unspecified: Secondary | ICD-10-CM

## 2018-12-26 NOTE — Telephone Encounter (Signed)
Requested medication (s) are due for refill today: yes  Requested medication (s) are on the active medication list: yes  Last refill:  12/16/2018  Future visit scheduled: no  Notes to clinic:  Review for refill   Requested Prescriptions  Pending Prescriptions Disp Refills   Lidocaine-Hydrocortisone Ace (LIDOCAINE-HYDROCORT, PERIANAL,) 3-0.5 % CREA [Pharmacy Med Name: LIDOCAINE 3%/HC 0.5% CREAM 28.35GM] 28.3 g 1    Sig: APPLY EXTERNALLY TO THE AFFECTED AREA TWICE DAILY     There is no refill protocol information for this order

## 2019-01-23 ENCOUNTER — Other Ambulatory Visit: Payer: Self-pay | Admitting: Registered Nurse

## 2019-01-23 DIAGNOSIS — K602 Anal fissure, unspecified: Secondary | ICD-10-CM

## 2019-02-14 ENCOUNTER — Other Ambulatory Visit: Payer: Self-pay | Admitting: Registered Nurse

## 2019-02-14 DIAGNOSIS — K602 Anal fissure, unspecified: Secondary | ICD-10-CM

## 2019-02-26 ENCOUNTER — Telehealth: Payer: Self-pay | Admitting: Registered Nurse

## 2019-02-26 DIAGNOSIS — K602 Anal fissure, unspecified: Secondary | ICD-10-CM

## 2019-02-26 NOTE — Telephone Encounter (Signed)
Requested medication (s) are due for refill today: no  Requested medication (s) are on the active medication list: yes  Last refill:  02/14/2019  Future visit scheduled: no  Notes to clinic:  Review for refill   Requested Prescriptions  Pending Prescriptions Disp Refills   Lidocaine-Hydrocort, Perianal, 3-0.5 % CREA [Pharmacy Med Name: LIDOCAINE 3%/HC 0.5% CREAM 28.35GM] 28.35 g     Sig: APPLY EXTERNALLY TO THE AFFECTED AREA TWICE DAILY     Off-Protocol Failed - 02/26/2019 10:10 AM      Failed - Medication not assigned to a protocol, review manually.      Passed - Valid encounter within last 12 months    Recent Outpatient Visits          4 months ago Anal fissure   [facility] [place], NP   3 years ago Abdominal pain, unspecified abdominal location   [facility] at [facility], PA-C   4 years ago Bilateral low back pain without sciatica   [facility] at [facility], Renette Butters, MD

## 2019-03-03 NOTE — Telephone Encounter (Signed)
Pt called stating he has not had this refilled. Please advise.

## 2019-03-04 ENCOUNTER — Telehealth: Payer: Self-pay | Admitting: Registered Nurse

## 2019-03-04 NOTE — Telephone Encounter (Signed)
Pt needs a refill on his prescription for lidocaine. Pt ran out last Thursday and is having a difficult time moving at work. Pt would like prescription to be filled today. Please advise

## 2019-03-04 NOTE — Telephone Encounter (Signed)
Pt is calling checking on the status of refill request

## 2019-03-04 NOTE — Telephone Encounter (Signed)
Pt called once again stating he really needs the Rx to be filled. Pt requests call with update on Rx refill request.

## 2019-03-05 ENCOUNTER — Other Ambulatory Visit: Payer: Self-pay | Admitting: Registered Nurse

## 2019-03-05 ENCOUNTER — Telehealth: Payer: Self-pay | Admitting: Registered Nurse

## 2019-03-05 DIAGNOSIS — K602 Anal fissure, unspecified: Secondary | ICD-10-CM

## 2019-03-05 MED ORDER — LIDOCAINE-HYDROCORT (PERIANAL) 3-0.5 % EX CREA: 1.0000 "application " | TOPICAL_CREAM | Freq: Two times a day (BID) | CUTANEOUS | 3 refills | Status: DC

## 2019-03-05 NOTE — Telephone Encounter (Signed)
Refill sent   Thank you,  Kathrin Ruddy, NP

## 2019-03-05 NOTE — Telephone Encounter (Signed)
rx refill Lidocaine-Hydrocort, Perianal, 3-0.5 % CREA  PHARMACY [id number] - [place], [address] AT [address] [phone] (Phone) [phone] (Fax)

## 2019-03-05 NOTE — Telephone Encounter (Signed)
Pt calling back again needs refill faxed to pharmacy for lidocaine.   FR  He has one out for 5 days now needs to go back work and is unable with medications   Fr

## 2019-03-13 NOTE — Telephone Encounter (Signed)
Prescription was refilled 03/05/19 with 3 refills.

## 2019-08-08 ENCOUNTER — Other Ambulatory Visit: Payer: Self-pay

## 2019-08-08 ENCOUNTER — Encounter: Payer: Self-pay | Admitting: Registered Nurse

## 2019-08-08 ENCOUNTER — Ambulatory Visit (INDEPENDENT_AMBULATORY_CARE_PROVIDER_SITE_OTHER): Payer: 59 | Admitting: Registered Nurse

## 2019-08-08 VITALS — BP 144/91 | HR 85 | Temp 98.0°F | Resp 16 | Ht 71.0 in | Wt 190.0 lb

## 2019-08-08 DIAGNOSIS — Z1211 Encounter for screening for malignant neoplasm of colon: Secondary | ICD-10-CM

## 2019-08-08 DIAGNOSIS — R251 Tremor, unspecified: Secondary | ICD-10-CM | POA: Diagnosis not present

## 2019-08-08 DIAGNOSIS — Z1329 Encounter for screening for other suspected endocrine disorder: Secondary | ICD-10-CM | POA: Diagnosis not present

## 2019-08-08 DIAGNOSIS — Z1159 Encounter for screening for other viral diseases: Secondary | ICD-10-CM

## 2019-08-08 DIAGNOSIS — F411 Generalized anxiety disorder: Secondary | ICD-10-CM | POA: Diagnosis not present

## 2019-08-08 DIAGNOSIS — K602 Anal fissure, unspecified: Secondary | ICD-10-CM

## 2019-08-08 DIAGNOSIS — Z13220 Encounter for screening for lipoid disorders: Secondary | ICD-10-CM

## 2019-08-08 DIAGNOSIS — Z13228 Encounter for screening for other metabolic disorders: Secondary | ICD-10-CM

## 2019-08-08 DIAGNOSIS — Z130 Encounter for screening for diseases of the blood and blood-forming organs and certain disorders involving the immune mechanism: Secondary | ICD-10-CM

## 2019-08-09 LAB — COMPREHENSIVE METABOLIC PANEL
ALT: 41 IU/L (ref 0–44)
AST: 49 IU/L — ABNORMAL HIGH (ref 0–40)
Albumin/Globulin Ratio: 1.7 (ref 1.2–2.2)
Albumin: 4.3 g/dL (ref 4.0–5.0)
Alkaline Phosphatase: 225 IU/L — ABNORMAL HIGH (ref 39–117)
BUN/Creatinine Ratio: 5 — ABNORMAL LOW (ref 9–20)
BUN: 4 mg/dL — ABNORMAL LOW (ref 6–24)
Bilirubin Total: 0.4 mg/dL (ref 0.0–1.2)
CO2: 27 mmol/L (ref 20–29)
Calcium: 9.2 mg/dL (ref 8.7–10.2)
Chloride: 101 mmol/L (ref 96–106)
Creatinine, Ser: 0.88 mg/dL (ref 0.76–1.27)
GFR calc Af Amer: 116 mL/min/{1.73_m2} (ref >59)
GFR calc non Af Amer: 100 mL/min/{1.73_m2} (ref >59)
Globulin, Total: 2.5 g/dL (ref 1.5–4.5)
Glucose: 92 mg/dL (ref 65–99)
Potassium: 4.9 mmol/L (ref 3.5–5.2)
Sodium: 139 mmol/L (ref 134–144)
Total Protein: 6.8 g/dL (ref 6.0–8.5)

## 2019-08-09 LAB — CBC WITH DIFFERENTIAL/PLATELET
Basophils Absolute: 0.1 10*3/uL (ref 0.0–0.2)
Basos: 1 %
EOS (ABSOLUTE): 0.3 10*3/uL (ref 0.0–0.4)
Eos: 4 %
Hematocrit: 51.3 % — ABNORMAL HIGH (ref 37.5–51.0)
Hemoglobin: 17.5 g/dL (ref 13.0–17.7)
Immature Grans (Abs): 0.1 10*3/uL (ref 0.0–0.1)
Immature Granulocytes: 1 %
Lymphocytes Absolute: 1.1 10*3/uL (ref 0.7–3.1)
Lymphs: 11 %
MCH: 33.4 pg — ABNORMAL HIGH (ref 26.6–33.0)
MCHC: 34.1 g/dL (ref 31.5–35.7)
MCV: 98 fL — ABNORMAL HIGH (ref 79–97)
Monocytes Absolute: 1.1 10*3/uL — ABNORMAL HIGH (ref 0.1–0.9)
Monocytes: 11 %
Neutrophils Absolute: 7.1 10*3/uL — ABNORMAL HIGH (ref 1.4–7.0)
Neutrophils: 72 %
Platelets: 148 10*3/uL — ABNORMAL LOW (ref 150–450)
RBC: 5.24 x10E6/uL (ref 4.14–5.80)
RDW: 12.8 % (ref 11.6–15.4)
WBC: 9.7 10*3/uL (ref 3.4–10.8)

## 2019-08-09 LAB — LIPID PANEL
Chol/HDL Ratio: 2.1 ratio (ref 0.0–5.0)
Cholesterol, Total: 161 mg/dL (ref 100–199)
HDL: 77 mg/dL (ref >39)
LDL Chol Calc (NIH): 69 mg/dL (ref 0–99)
Triglycerides: 80 mg/dL (ref 0–149)
VLDL Cholesterol Cal: 15 mg/dL (ref 5–40)

## 2019-08-09 LAB — VITAMIN D 25 HYDROXY (VIT D DEFICIENCY, FRACTURES): Vit D, 25-Hydroxy: 14.4 ng/mL — ABNORMAL LOW (ref 30.0–100.0)

## 2019-08-09 LAB — HEMOGLOBIN A1C
Est. average glucose Bld gHb Est-mCnc: 100 mg/dL
Hgb A1c MFr Bld: 5.1 % (ref 4.8–5.6)

## 2019-08-09 LAB — VITAMIN B12: Vitamin B-12: 564 pg/mL (ref 232–1245)

## 2019-08-09 LAB — TSH: TSH: 1.36 u[IU]/mL (ref 0.450–4.500)

## 2019-08-09 LAB — HIV ANTIBODY (ROUTINE TESTING W REFLEX): HIV Screen 4th Generation wRfx: NONREACTIVE

## 2019-08-11 ENCOUNTER — Encounter: Payer: Self-pay | Admitting: Registered Nurse

## 2019-08-11 MED ORDER — LIDOCAINE-HYDROCORT (PERIANAL) 3-0.5 % EX CREA: 1.0000 "application " | TOPICAL_CREAM | Freq: Two times a day (BID) | CUTANEOUS | 3 refills | Status: DC

## 2019-08-11 MED ORDER — ALPRAZOLAM 0.25 MG PO TABS: 0.2500 mg | ORAL_TABLET | Freq: Every evening | ORAL | 0 refills | Status: DC | PRN

## 2019-08-11 MED ORDER — ESCITALOPRAM OXALATE 10 MG PO TABS
10.0000 mg | ORAL_TABLET | Freq: Every day | ORAL | 0 refills | Status: DC
Start: 2019-08-11 — End: 2019-11-10

## 2019-08-12 ENCOUNTER — Encounter: Payer: Self-pay | Admitting: Registered Nurse

## 2019-08-13 ENCOUNTER — Other Ambulatory Visit: Payer: Self-pay | Admitting: Registered Nurse

## 2019-08-13 ENCOUNTER — Encounter: Payer: Self-pay | Admitting: Registered Nurse

## 2019-08-13 DIAGNOSIS — E559 Vitamin D deficiency, unspecified: Secondary | ICD-10-CM

## 2019-08-13 DIAGNOSIS — R1032 Left lower quadrant pain: Secondary | ICD-10-CM

## 2019-08-13 MED ORDER — AMOXICILLIN-POT CLAVULANATE 875-125 MG PO TABS: 1.0000 | ORAL_TABLET | Freq: Two times a day (BID) | ORAL | 0 refills | Status: DC

## 2019-08-13 MED ORDER — VITAMIN D (ERGOCALCIFEROL) 1.25 MG (50000 UNIT) PO CAPS: 50000.0000 [IU] | ORAL_CAPSULE | ORAL | 0 refills | Status: DC

## 2019-08-26 ENCOUNTER — Encounter: Payer: Self-pay | Admitting: Registered Nurse

## 2019-08-26 NOTE — Progress Notes (Signed)
Established Patient Office Visit  Subjective:  Patient ID: Aaron Davidson, male    DOB: [birth year]  Age: 49 y.o. MRN: [id number]  CC:  Chief Complaint  Patient presents with  . Abdominal Cramping    patient states he know he can not eat cucumbers but ate a sub with lettuce and thinks lettuce may be a concerns at this point. Per patient the first two days he was very sick , but now he is still cramping   . Referral    Patient needs a referral     HPI Aaron Davidson presents for abdominal cramping.  Onset 2-3 days ago. Lower left quadrant Worse after eating cucumbers and lettuce on a sandwich.  Painful BM d/t cramping and hx of hemorrhoids and anal fissure. BRBPR but states no more than usual with hemorrhoid flare.  Requesting referral to GI now that he has insurance - due for colonoscopy.  Also requesting med refills and labs today.   No past medical history on file.  Past Surgical History:  Procedure Laterality Date  . DENTAL SURGERY    . INGUINAL HERNIA REPAIR Left 12/13/2015   Procedure: LAPAROSCOPIC LEFT  INGUINAL HERNIA;  Surgeon: Ralene Ok, MD;  Location: [facility];  Service: General;  Laterality: Left;  . INSERTION OF MESH Left 12/13/2015   Procedure: INSERTION OF MESH;  Surgeon: Ralene Ok, MD;  Location: [facility];  Service: General;  Laterality: Left;  . KNEE SURGERY Right   . PILONIDAL CYST EXCISION      Family History  Problem Relation Age of Onset  . Heart disease Father     Social History   Socioeconomic History  . Marital status: Divorced    Spouse name: Not on file  . Number of children: Not on file  . Years of education: Not on file  . Highest education level: Not on file  Occupational History  . Not on file  Tobacco Use  . Smoking status: Current Every Day Smoker    Packs/day: 2.00    Years: 38.00    Pack years: 76.00    Types: Cigarettes  . Smokeless tobacco: Never Used  Substance and Sexual Activity  . Alcohol use: Yes   Alcohol/week: 1.0 standard drinks    Types: 1 Standard drinks or equivalent per week    Comment: rare  . Drug use: No  . Sexual activity: Not on file  Other Topics Concern  . Not on file  Social History Narrative  . Not on file   Social Determinants of Health   Financial Resource Strain:   . Difficulty of Paying Living Expenses:   Food Insecurity:   . Worried About Charity fundraiser in the Last Year:   . Arboriculturist in the Last Year:   Transportation Needs:   . Film/video editor (Medical):   Marland Kitchen Lack of Transportation (Non-Medical):   Physical Activity:   . Days of Exercise per Week:   . Minutes of Exercise per Session:   Stress:   . Feeling of Stress :   Social Connections:   . Frequency of Communication with Friends and Family:   . Frequency of Social Gatherings with Friends and Family:   . Attends Religious Services:   . Active Member of Clubs or Organizations:   . Attends Archivist Meetings:   Marland Kitchen Marital Status:   Intimate Partner Violence:   . Fear of Current or Ex-Partner:   . Emotionally Abused:   .  Physically Abused:   . Sexually Abused:     Outpatient Medications Prior to Visit  Medication Sig Dispense Refill  . Lidocaine-Hydrocort, Perianal, 3-0.5 % CREA Place 1 application rectally 2 (two) times daily. 85 g 3   No facility-administered medications prior to visit.    Allergies  Allergen Reactions  . No Known Allergies     ROS Review of Systems  Constitutional: Negative.   HENT: Negative.   Eyes: Negative.   Respiratory: Negative.   Cardiovascular: Negative.   Gastrointestinal: Negative.   Endocrine: Negative.   Genitourinary: Negative.   Musculoskeletal: Negative.   Skin: Negative.   Allergic/Immunologic: Negative.   Neurological: Negative.   Hematological: Negative.   Psychiatric/Behavioral: Negative.   All other systems reviewed and are negative.     Objective:    Physical Exam  Constitutional: He is oriented to  person, place, and time. He appears well-developed. No distress.  Cardiovascular: Normal rate, regular rhythm and normal heart sounds. Exam reveals no gallop and no friction rub.  No murmur heard. Pulmonary/Chest: Effort normal and breath sounds normal. No respiratory distress. He has no wheezes. He has no rales. He exhibits no tenderness.  Abdominal: Soft. Bowel sounds are normal. He exhibits no distension and no mass. There is abdominal tenderness (llq). There is no rebound and no guarding.  Musculoskeletal:        General: No tenderness, deformity or edema. Normal range of motion.  Neurological: He is alert and oriented to person, place, and time. No cranial nerve deficit.  Skin: Skin is warm and dry. No rash noted. He is not diaphoretic. No erythema. No pallor.  Psychiatric: He has a normal mood and affect. His behavior is normal. Judgment and thought content normal.  Nursing note and vitals reviewed.   BP (!) 144/91   Pulse 85   Temp 98 F (36.7 C) (Temporal)   Resp 16   Ht 5\' 11"  (1.803 m)   Wt 190 lb (86.2 kg)   SpO2 98%   BMI 26.50 kg/m  Wt Readings from Last 3 Encounters:  08/08/19 190 lb (86.2 kg)  09/30/18 181 lb (82.1 kg)  12/13/15 184 lb 4.8 oz (83.6 kg)     There are no preventive care reminders to display for this patient.  There are no preventive care reminders to display for this patient.  Lab Results  Component Value Date   TSH 1.360 08/08/2019   Lab Results  Component Value Date   WBC 9.7 08/08/2019   HGB 17.5 08/08/2019   HCT 51.3 (H) 08/08/2019   MCV 98 (H) 08/08/2019   PLT 148 (L) 08/08/2019   Lab Results  Component Value Date   NA 139 08/08/2019   K 4.9 08/08/2019   CO2 27 08/08/2019   GLUCOSE 92 08/08/2019   BUN 4 (L) 08/08/2019   CREATININE 0.88 08/08/2019   BILITOT 0.4 08/08/2019   ALKPHOS 225 (H) 08/08/2019   AST 49 (H) 08/08/2019   ALT 41 08/08/2019   PROT 6.8 08/08/2019   ALBUMIN 4.3 08/08/2019   CALCIUM 9.2 08/08/2019   Lab  Results  Component Value Date   CHOL 161 08/08/2019   Lab Results  Component Value Date   HDL 77 08/08/2019   Lab Results  Component Value Date   LDLCALC 69 08/08/2019   Lab Results  Component Value Date   TRIG 80 08/08/2019   Lab Results  Component Value Date   CHOLHDL 2.1 08/08/2019   Lab Results  Component Value Date  HGBA1C 5.1 08/08/2019      Assessment & Plan:   Problem List Items Addressed This Visit    None    Visit Diagnoses    Screening for endocrine, metabolic and immunity disorder    -  Primary   Relevant Orders   CBC with Differential (Completed)   Comprehensive metabolic panel (Completed)   TSH (Completed)   Hemoglobin A1c (Completed)   Lipid screening       Relevant Orders   Lipid panel (Completed)   Encounter for screening for other viral diseases       Relevant Orders   HIV antibody (with reflex) (Completed)   Special screening for malignant neoplasms, colon       Relevant Orders   Ambulatory referral to Gastroenterology   Tremor       Relevant Orders   Vitamin D, 25-hydroxy (Completed)   Vitamin B12 (Completed)   Anal fissure       Relevant Medications   Lidocaine-Hydrocort, Perianal, 3-0.5 % CREA   GAD (generalized anxiety disorder)       Relevant Medications   escitalopram (LEXAPRO) 10 MG tablet   ALPRAZolam (XANAX) 0.25 MG tablet      Meds ordered this encounter  Medications  . escitalopram (LEXAPRO) 10 MG tablet    Sig: Take 1 tablet (10 mg total) by mouth daily.    Dispense:  90 tablet    Refill:  0    Order Specific Question:   Supervising Provider    Answer:   Delia Chimes A [id number]  . ALPRAZolam (XANAX) 0.25 MG tablet    Sig: Take 1 tablet (0.25 mg total) by mouth at bedtime as needed for anxiety.    Dispense:  30 tablet    Refill:  0    Order Specific Question:   Supervising Provider    Answer:   Delia Chimes A [id number]  . Lidocaine-Hydrocort, Perianal, 3-0.5 % CREA    Sig: Place 1 application rectally 2  (two) times daily.    Dispense:  85 g    Refill:  3    Order Specific Question:   Supervising Provider    Answer:   Forrest Moron [id number]    Follow-up: No follow-ups on file.   PLAN  Suspect diverticulitis, will treat with abx and bowel rest with mainly clear liquid diet, can try BRAT foods as tolerated  Refer to GI for pain and colonoscopy.  Start escitalopram for anxiety, alprazolam 0.25mg  PO qd prn for breakthrough anxiety  Refill lidocaine-hydrocortisone cream for hemorrhoids  Patient encouraged to call clinic with any questions, comments, or concerns.  Maximiano Coss, NP

## 2019-08-27 ENCOUNTER — Encounter: Payer: Self-pay | Admitting: Registered Nurse

## 2019-09-07 ENCOUNTER — Other Ambulatory Visit: Payer: Self-pay | Admitting: Registered Nurse

## 2019-09-07 DIAGNOSIS — F411 Generalized anxiety disorder: Secondary | ICD-10-CM

## 2019-09-08 ENCOUNTER — Other Ambulatory Visit: Payer: Self-pay | Admitting: Registered Nurse

## 2019-09-08 DIAGNOSIS — K649 Unspecified hemorrhoids: Secondary | ICD-10-CM

## 2019-09-08 MED ORDER — ALPRAZOLAM 0.25 MG PO TABS: 0.2500 mg | ORAL_TABLET | Freq: Every evening | ORAL | 0 refills | Status: DC | PRN

## 2019-09-08 NOTE — Telephone Encounter (Signed)
Patient is requesting a refill of the following medications: Requested Prescriptions   Pending Prescriptions Disp Refills  . ALPRAZolam (XANAX) 0.25 MG tablet 30 tablet 0    Sig: Take 1 tablet (0.25 mg total) by mouth at bedtime as needed for anxiety.    Date of patient request: 09/07/19 Last office visit: 08/08/19 Date of last refill: 08/11/19 Last refill amount: 30 Follow up time period per chart: 09/12/19

## 2019-09-12 ENCOUNTER — Telehealth (INDEPENDENT_AMBULATORY_CARE_PROVIDER_SITE_OTHER): Payer: 59 | Admitting: Registered Nurse

## 2019-09-12 ENCOUNTER — Other Ambulatory Visit: Payer: Self-pay

## 2019-09-12 ENCOUNTER — Encounter: Payer: Self-pay | Admitting: Registered Nurse

## 2019-09-12 DIAGNOSIS — N529 Male erectile dysfunction, unspecified: Secondary | ICD-10-CM | POA: Diagnosis not present

## 2019-09-12 DIAGNOSIS — E559 Vitamin D deficiency, unspecified: Secondary | ICD-10-CM | POA: Diagnosis not present

## 2019-09-12 MED ORDER — VITAMIN D (ERGOCALCIFEROL) 1.25 MG (50000 UNIT) PO CAPS: 50000.0000 [IU] | ORAL_CAPSULE | ORAL | 0 refills | Status: DC

## 2019-09-12 MED ORDER — SILDENAFIL CITRATE 25 MG PO TABS: 25.0000 mg | ORAL_TABLET | Freq: Every day | ORAL | 3 refills | Status: DC | PRN

## 2019-09-12 NOTE — Patient Instructions (Signed)
° ° ° °  If you have lab work done today you will be contacted with your lab results within the next 2 weeks.  If you have not heard from us then please contact us. The fastest way to get your results is to register for My Chart. ° ° °IF you received an x-ray today, you will receive an invoice from [facility]. Please contact [facility] at [phone] with questions or concerns regarding your invoice.  ° °IF you received labwork today, you will receive an invoice from [facility]. Please contact [facility] at [phone] with questions or concerns regarding your invoice.  ° °Our billing staff will not be able to assist you with questions regarding bills from these companies. ° °You will be contacted with the lab results as soon as they are available. The fastest way to get your results is to activate your My Chart account. Instructions are located on the last page of this paperwork. If you have not heard from us regarding the results in 2 weeks, please contact this office. °  ° ° ° °

## 2019-09-15 ENCOUNTER — Telehealth: Payer: Self-pay | Admitting: *Deleted

## 2019-09-15 NOTE — Telephone Encounter (Signed)
PA FOR SILDENAFIL  SENT [id number]

## 2019-09-16 ENCOUNTER — Telehealth: Payer: Self-pay | Admitting: *Deleted

## 2019-09-16 NOTE — Telephone Encounter (Signed)
Prior authorization has been approved for 1 year

## 2019-09-19 ENCOUNTER — Encounter: Payer: Self-pay | Admitting: Registered Nurse

## 2019-09-19 DIAGNOSIS — N529 Male erectile dysfunction, unspecified: Secondary | ICD-10-CM

## 2019-09-19 MED ORDER — SILDENAFIL CITRATE 25 MG PO TABS: 50.0000 mg | ORAL_TABLET | Freq: Every day | ORAL | 3 refills | Status: AC | PRN

## 2019-09-19 NOTE — Telephone Encounter (Signed)
Aaron Davidson [place].

## 2019-09-19 NOTE — Telephone Encounter (Signed)
Pt insurance wont cover more than 6 pills for one month, pt wants you to send in a better covered alternative.

## 2019-09-22 NOTE — Telephone Encounter (Signed)
Attempted to called patient to see if he went to the pharmacy to get the medication.

## 2019-09-25 ENCOUNTER — Encounter: Payer: Self-pay | Admitting: Registered Nurse

## 2019-09-26 NOTE — Telephone Encounter (Signed)
Patient states the he is still in pain in after taking the antibiotic that was prescribed for him .

## 2019-09-29 ENCOUNTER — Other Ambulatory Visit: Payer: Self-pay | Admitting: Registered Nurse

## 2019-09-29 DIAGNOSIS — R1032 Left lower quadrant pain: Secondary | ICD-10-CM

## 2019-09-29 MED ORDER — DICYCLOMINE HCL 10 MG PO CAPS: 10.0000 mg | ORAL_CAPSULE | Freq: Three times a day (TID) | ORAL | 0 refills | Status: DC

## 2019-09-29 NOTE — Progress Notes (Signed)
Pt with ongoing abdominal pain after abx use - awaiting hemorrhoid removal surg and then colonoscopy - will try bentyl and BRAT diet  Kathrin Ruddy, NP

## 2019-10-06 ENCOUNTER — Other Ambulatory Visit: Payer: Self-pay | Admitting: Registered Nurse

## 2019-10-06 DIAGNOSIS — E559 Vitamin D deficiency, unspecified: Secondary | ICD-10-CM

## 2019-10-06 DIAGNOSIS — F411 Generalized anxiety disorder: Secondary | ICD-10-CM

## 2019-10-07 ENCOUNTER — Other Ambulatory Visit: Payer: Self-pay | Admitting: Registered Nurse

## 2019-10-07 DIAGNOSIS — F411 Generalized anxiety disorder: Secondary | ICD-10-CM

## 2019-10-07 NOTE — Telephone Encounter (Signed)
  Rx of Xanax has already been sent to pharmacy today.

## 2019-10-16 ENCOUNTER — Other Ambulatory Visit: Payer: Self-pay | Admitting: Registered Nurse

## 2019-10-16 DIAGNOSIS — R1032 Left lower quadrant pain: Secondary | ICD-10-CM

## 2019-10-24 ENCOUNTER — Other Ambulatory Visit: Payer: Self-pay | Admitting: Registered Nurse

## 2019-10-24 ENCOUNTER — Encounter: Payer: Self-pay | Admitting: Registered Nurse

## 2019-10-24 DIAGNOSIS — K602 Anal fissure, unspecified: Secondary | ICD-10-CM

## 2019-10-24 MED ORDER — LIDOCAINE-HYDROCORT (PERIANAL) 3-0.5 % EX CREA: 1.0000 "application " | TOPICAL_CREAM | Freq: Two times a day (BID) | CUTANEOUS | 5 refills | Status: DC

## 2019-10-27 ENCOUNTER — Other Ambulatory Visit: Payer: Self-pay | Admitting: Surgery

## 2019-10-27 DIAGNOSIS — K602 Anal fissure, unspecified: Secondary | ICD-10-CM

## 2019-10-28 ENCOUNTER — Encounter: Payer: Self-pay | Admitting: Registered Nurse

## 2019-10-28 NOTE — Telephone Encounter (Signed)
Please advise 

## 2019-11-03 ENCOUNTER — Other Ambulatory Visit: Payer: Self-pay | Admitting: Registered Nurse

## 2019-11-03 DIAGNOSIS — R1032 Left lower quadrant pain: Secondary | ICD-10-CM

## 2019-11-07 ENCOUNTER — Ambulatory Visit
Admission: RE | Admit: 2019-11-07 | Discharge: 2019-11-07 | Disposition: A | Discharge status: Home / Self Care | Payer: 59 | Source: Ambulatory Visit | Attending: Surgery | Admitting: Surgery

## 2019-11-07 DIAGNOSIS — K602 Anal fissure, unspecified: Secondary | ICD-10-CM

## 2019-11-07 MED ORDER — IOPAMIDOL (ISOVUE-300) INJECTION 61%
100.0000 mL | Freq: Once | INTRAVENOUS | Status: AC | PRN
  Administered 2019-11-07: 100 mL via INTRAVENOUS

## 2019-11-08 ENCOUNTER — Other Ambulatory Visit: Payer: Self-pay | Admitting: Registered Nurse

## 2019-11-08 ENCOUNTER — Telehealth: Payer: Self-pay | Admitting: Surgery

## 2019-11-08 DIAGNOSIS — F411 Generalized anxiety disorder: Secondary | ICD-10-CM

## 2019-11-08 NOTE — Telephone Encounter (Signed)
Attempted x 2 to call patient to discuss CT results, went to voicemail. I will try again tomorrow. Epic message sent to notify PCP of CT as well.

## 2019-11-10 ENCOUNTER — Other Ambulatory Visit (HOSPITAL_COMMUNITY): Payer: Self-pay | Admitting: Surgery

## 2019-11-10 ENCOUNTER — Telehealth: Payer: Self-pay | Admitting: Surgery

## 2019-11-10 DIAGNOSIS — R109 Unspecified abdominal pain: Secondary | ICD-10-CM

## 2019-11-10 NOTE — Telephone Encounter (Signed)
Called patient to discuss CT results and next steps. He continues to have significant pain and limited PO.  We have placed orders for IR for biopsy and referral to oncology.  Will order echo He may also need upper/lower endoscopy if primary is unclear after IR bx. Rx sent to his pharmacy for oxcodone and zofran. Advised him to please come to the ER if pain is not tolerable, or unable to tolerate sufficient PO, or if develops other issues.

## 2019-11-10 NOTE — Telephone Encounter (Signed)
Called pt scheduled appt.

## 2019-11-10 NOTE — Telephone Encounter (Signed)
rx sent to pharmacy. Pt should make f/u for future refills so we know how well medication is working for him.

## 2019-11-11 ENCOUNTER — Other Ambulatory Visit (HOSPITAL_COMMUNITY): Payer: Self-pay | Admitting: Surgery

## 2019-11-11 ENCOUNTER — Encounter (HOSPITAL_COMMUNITY): Payer: Self-pay | Admitting: Radiology

## 2019-11-11 ENCOUNTER — Other Ambulatory Visit: Payer: Self-pay | Admitting: Registered Nurse

## 2019-11-11 DIAGNOSIS — R109 Unspecified abdominal pain: Secondary | ICD-10-CM

## 2019-11-11 DIAGNOSIS — R1900 Intra-abdominal and pelvic swelling, mass and lump, unspecified site: Secondary | ICD-10-CM

## 2019-11-11 DIAGNOSIS — F411 Generalized anxiety disorder: Secondary | ICD-10-CM

## 2019-11-11 NOTE — Telephone Encounter (Signed)
Patient is requesting a refill of the following medications: Requested Prescriptions   Pending Prescriptions Disp Refills   ALPRAZolam (XANAX) 0.25 MG tablet [Pharmacy Med Name: ALPRAZOLAM 0.25MG  TABLETS] 30 tablet     Sig: TAKE 1 TABLET(0.25 MG) BY MOUTH AT BEDTIME AS NEEDED FOR ANXIETY.    Date of patient request: 11/11/19 Last office visit: 09/12/19 Date of last refill: 10/07/19 Last refill amount: 30 Follow up time period per chart: 11/25/19

## 2019-11-11 NOTE — Progress Notes (Signed)
Aaron Davidson Male, 49 y.o., Oct 24, 1970 MRN:  [id number] Phone:  [phone] Aaron Davidson) PCP:  Aaron Coss, NP Primary Cvg:  [facility] ([facility]) 11/19/2019 at 11:00 AM  RE: [place] Core Soft Tissue Biopsy Received: Today [name], MD  Arlyn Leak   CT core peritoneal process  Lymphoma v carcinoma   [facility]       Previous Messages   ----- Message -----  From: Garth Bigness D  Sent: 11/11/2019  9:45 AM EDT  To: Ir Procedure Requests  Subject: [place] Core Soft Tissue Biopsy            Procedure:  [place] CORE BIOPSY (SOFT TISSUE)   Reason: Intraabdominal mass, Abdominal pain, unspecified abdominal location, evaluate for biopsy of intraabdominal mass   History: CT in computer   Provider: Romana Juniper A   Provider Contact: [phone]

## 2019-11-12 ENCOUNTER — Other Ambulatory Visit (HOSPITAL_COMMUNITY): Payer: 59

## 2019-11-12 ENCOUNTER — Other Ambulatory Visit: Payer: Self-pay

## 2019-11-12 ENCOUNTER — Telehealth: Payer: Self-pay | Admitting: Hematology and Oncology

## 2019-11-12 ENCOUNTER — Ambulatory Visit (HOSPITAL_COMMUNITY)
Admission: RE | Admit: 2019-11-12 | Discharge: 2019-11-12 | Disposition: A | Discharge status: Home / Self Care | Payer: 59 | Source: Ambulatory Visit | Attending: Surgery | Admitting: Surgery

## 2019-11-12 DIAGNOSIS — R109 Unspecified abdominal pain: Secondary | ICD-10-CM | POA: Diagnosis not present

## 2019-11-12 LAB — ECHOCARDIOGRAM COMPLETE
Area-P 1/2: 6.54 cm2
S' Lateral: 2.7 cm

## 2019-11-12 NOTE — Telephone Encounter (Signed)
Received a new pt referral from Dr. Kae Heller at [place] [facility] for abdominal mass. Mr. Aaron Davidson has been cld and scheduled to see Dr. Lorenso Courier on 8/26 at 1pm. Pt has a bx scheduled on 8/26 which is why oncology appt is scheduled afterwards. Mr. Aaron Davidson is aware to arrive 15 minutes early.

## 2019-11-12 NOTE — Progress Notes (Signed)
  Echocardiogram 2D Echocardiogram has been performed.  Jennette Dubin 11/12/2019, 3:56 PM

## 2019-11-14 ENCOUNTER — Encounter: Payer: Self-pay | Admitting: Registered Nurse

## 2019-11-17 ENCOUNTER — Telehealth: Payer: Self-pay | Admitting: Registered Nurse

## 2019-11-17 ENCOUNTER — Encounter: Payer: Self-pay | Admitting: Registered Nurse

## 2019-11-17 ENCOUNTER — Other Ambulatory Visit: Payer: Self-pay | Admitting: Registered Nurse

## 2019-11-17 DIAGNOSIS — R1032 Left lower quadrant pain: Secondary | ICD-10-CM

## 2019-11-17 MED ORDER — OXYCODONE HCL 5 MG PO TABS: 5.0000 mg | ORAL_TABLET | Freq: Four times a day (QID) | ORAL | 0 refills | Status: DC | PRN

## 2019-11-17 NOTE — Telephone Encounter (Signed)
Medication: oxyCODONE (OXY IR/ROXICODONE) 5 MG immediate release tablet [[id number]]   Has the patient contacted their pharmacy? YES  (Agent: If no, request that the patient contact the pharmacy for the refill.) (Agent: If yes, when and what did the pharmacy advise?)  Preferred Pharmacy (with phone number or street name): [id number] - [address], [address]  Phone:  [phone] Fax:  [phone]     Agent: Please be advised that RX refills may take up to 3 business days. We ask that you follow-up with your pharmacy.

## 2019-11-17 NOTE — Telephone Encounter (Signed)
Patient is requesting a refill of the following medications: Requested Prescriptions    No prescriptions requested or ordered in this encounter    Date of patient request: 11/17/19 Last office visit: 09/12/19 Date of last refill: 11/10/19 Last refill amount:  Follow up time period per chart: 11/25/19

## 2019-11-18 ENCOUNTER — Other Ambulatory Visit: Payer: Self-pay | Admitting: Radiology

## 2019-11-19 ENCOUNTER — Other Ambulatory Visit: Payer: Self-pay

## 2019-11-19 ENCOUNTER — Ambulatory Visit (HOSPITAL_COMMUNITY)
Admission: RE | Admit: 2019-11-19 | Discharge: 2019-11-19 | Disposition: A | Discharge status: Home / Self Care | Payer: 59 | Source: Ambulatory Visit | Attending: Surgery | Admitting: Surgery

## 2019-11-19 ENCOUNTER — Other Ambulatory Visit (HOSPITAL_COMMUNITY): Payer: Self-pay | Admitting: Surgery

## 2019-11-19 DIAGNOSIS — C481 Malignant neoplasm of specified parts of peritoneum: Secondary | ICD-10-CM | POA: Diagnosis not present

## 2019-11-19 DIAGNOSIS — R1900 Intra-abdominal and pelvic swelling, mass and lump, unspecified site: Secondary | ICD-10-CM | POA: Insufficient documentation

## 2019-11-19 DIAGNOSIS — R109 Unspecified abdominal pain: Secondary | ICD-10-CM | POA: Insufficient documentation

## 2019-11-19 DIAGNOSIS — F17210 Nicotine dependence, cigarettes, uncomplicated: Secondary | ICD-10-CM | POA: Diagnosis not present

## 2019-11-19 HISTORY — PX: IR US GUIDE BX ASP/DRAIN: IMG2392

## 2019-11-19 LAB — CBC
HCT: 50.8 % (ref 39.0–52.0)
Hemoglobin: 17 g/dL (ref 13.0–17.0)
MCH: 32.4 pg (ref 26.0–34.0)
MCHC: 33.5 g/dL (ref 30.0–36.0)
MCV: 96.9 fL (ref 80.0–100.0)
Platelets: 203 10*3/uL (ref 150–400)
RBC: 5.24 MIL/uL (ref 4.22–5.81)
RDW: 12.9 % (ref 11.5–15.5)
WBC: 9.5 10*3/uL (ref 4.0–10.5)
nRBC: 0 % (ref 0.0–0.2)

## 2019-11-19 LAB — APTT: aPTT: 32 seconds (ref 24–36)

## 2019-11-19 LAB — PROTIME-INR
INR: 0.9 (ref 0.8–1.2)
Prothrombin Time: 12.2 seconds (ref 11.4–15.2)

## 2019-11-19 MED ORDER — LIDOCAINE HCL (PF) 1 % IJ SOLN
INTRAMUSCULAR | Status: AC
  Filled 2019-11-19: qty 30

## 2019-11-19 MED ORDER — GELATIN ABSORBABLE 12-7 MM EX MISC
CUTANEOUS | Status: AC
  Filled 2019-11-19: qty 1

## 2019-11-19 MED ORDER — FENTANYL CITRATE (PF) 100 MCG/2ML IJ SOLN
INTRAMUSCULAR | Status: AC
  Administered 2019-11-19: 25 ug via INTRAVENOUS
  Filled 2019-11-19: qty 4

## 2019-11-19 MED ORDER — HYDROCODONE-ACETAMINOPHEN 5-325 MG PO TABS
1.0000 | ORAL_TABLET | Freq: Once | ORAL | Status: AC
  Administered 2019-11-19: 1 via ORAL
  Filled 2019-11-19: qty 1

## 2019-11-19 MED ORDER — FENTANYL CITRATE (PF) 100 MCG/2ML IJ SOLN: 25.0000 ug | Freq: Once | INTRAMUSCULAR | Status: AC

## 2019-11-19 MED ORDER — MIDAZOLAM HCL 2 MG/2ML IJ SOLN
INTRAMUSCULAR | Status: AC
  Filled 2019-11-19: qty 4

## 2019-11-19 MED ORDER — MIDAZOLAM HCL 2 MG/2ML IJ SOLN
INTRAMUSCULAR | Status: AC | PRN
  Administered 2019-11-19: 1 mg via INTRAVENOUS

## 2019-11-19 MED ORDER — FENTANYL CITRATE (PF) 100 MCG/2ML IJ SOLN
INTRAMUSCULAR | Status: AC
  Filled 2019-11-19: qty 2

## 2019-11-19 MED ORDER — FENTANYL CITRATE (PF) 100 MCG/2ML IJ SOLN
INTRAMUSCULAR | Status: AC | PRN
  Administered 2019-11-19 (×2): 25 ug via INTRAVENOUS

## 2019-11-19 MED ORDER — SODIUM CHLORIDE 0.9 % IV SOLN: INTRAVENOUS | Status: DC

## 2019-11-19 NOTE — Discharge Instructions (Signed)
Needle Biopsy, Care After This sheet gives you information about how to care for yourself after your procedure. Your health care provider may also give you more specific instructions. If you have problems or questions, contact your health care provider. What can I expect after the procedure? After the procedure, it is common to have soreness, bruising, or mild pain at the puncture site. This should go away in a few days. Follow these instructions at home: Needle insertion site care   Wash your hands with soap and water before you change your bandage (dressing). If you cannot use soap and water, use hand sanitizer.  Follow instructions from your health care provider about how to take care of your puncture site. This includes: ? When and how to change your dressing. ? When to remove your dressing.  Check your puncture site every day for signs of infection. Check for: ? Redness, swelling, or pain. ? Fluid or blood. ? Pus or a bad smell. ? Warmth. General instructions  Return to your normal activities as told by your health care provider. Ask your health care provider what activities are safe for you.  Do not take baths, swim, or use a hot tub until your health care provider approves. Ask your health care provider if you may take showers. You may only be allowed to take sponge baths.  Take over-the-counter and prescription medicines only as told by your health care provider.  Keep all follow-up visits as told by your health care provider. This is important. Contact a health care provider if:  You have a fever.  You have redness, swelling, or pain at the puncture site that lasts longer than a few days.  You have fluid, blood, or pus coming from your puncture site.  Your puncture site feels warm to the touch. Get help right away if:  You have severe bleeding from the puncture site. Summary  After the procedure, it is common to have soreness, bruising, or mild pain at the puncture  site. This should go away in a few days.  Check your puncture site every day for signs of infection, such as redness, swelling, or pain.  Get help right away if you have severe bleeding from your puncture site. This information is not intended to replace advice given to you by your health care provider. Make sure you discuss any questions you have with your health care provider. Document Revised: 06/01/2017 Document Reviewed: 04/02/2017 Elsevier Patient Education  2020 Elsevier Inc.  

## 2019-11-19 NOTE — Procedures (Signed)
Interventional Radiology Procedure Note  Procedure: [place] BX ABD OMENTAL MASS    Complications: None  Estimated Blood Loss:  MIN  Findings: 18 G CORES IN SALINE    Tamera Punt, MD

## 2019-11-19 NOTE — H&P (Signed)
Chief Complaint: Patient was seen in consultation today for image-guided peritoneal biopsy.   Referring Physician(s): Connor,Chelsea A  Supervising Physician: Daryll Brod  Patient Status: Aaron Davidson  History of Present Illness: Aaron Davidson is a 49 y.o. male with no significant past medical history. He presented to his PCP May 2021 with abdominal pain/cramping. There was concern for diverticulitis and he was treated with antibiotics and bowel rest. The abdominal pain/cramping persisted despite these efforts and a CT scan was ordered.    CT abdomen/pelvis with contrast 11/07/19:  IMPRESSION: 1. Extensive omental caking encasing nearly all of the viscera in the abdomen and pelvis. It demonstrates mass effect on the liver and spleen without any evidence of bowel obstruction. Differential considerations include lymphoma versus is a omental caking from a intestinal primary. 2. Given extensive caking and scalloping, it is difficult to distinguish liver margins. There are multiple possible hepatic masses. Recommend attention on follow-up. 3.  Mild bowel wall thickening of the sigmoid colon, nonspecific. 4. Filling defect within the LEFT atrial appendage could reflect clot versus mixing artifact. 5. Mild esophageal wall thickening is favored to reflect esophagitis. 6. Subpleural reticulation may reflect underlying mild pulmonary Fibrosis.  Interventional Radiology has been asked to perform an image-guided peritoneal biopsy for further work up and diagnosis. This case has been reviewed and procedure approved by Dr. Lenox Ahr  No past medical history on file.  Past Surgical History:  Procedure Laterality Date  . DENTAL SURGERY    . HERNIA REPAIR N/A    Phreesia 09/09/2019  . INGUINAL HERNIA REPAIR Left 12/13/2015   Procedure: LAPAROSCOPIC LEFT  INGUINAL HERNIA;  Surgeon: Aaron Ok, MD;  Location: [facility];  Service: General;  Laterality: Left;  . INSERTION OF MESH Left  12/13/2015   Procedure: INSERTION OF MESH;  Surgeon: Aaron Ok, MD;  Location: [facility];  Service: General;  Laterality: Left;  . KNEE SURGERY Right   . PILONIDAL CYST EXCISION      Allergies: No known allergies  Medications: Prior to Admission medications   Medication Sig Start Date End Date Taking? Authorizing Provider  ALPRAZolam (XANAX) 0.25 MG tablet TAKE 1 TABLET(0.25 MG) BY MOUTH AT BEDTIME AS NEEDED FOR ANXIETY. Patient taking differently: Take 0.25 mg by mouth at bedtime.  11/11/19  Yes Maximiano Coss, NP  dicyclomine (BENTYL) 10 MG capsule TAKE 1 CAPSULE(10 MG) BY MOUTH FOUR TIMES DAILY BEFORE MEALS AND AT BEDTIME Patient taking differently: Take 10 mg by mouth 4 (four) times daily as needed for spasms.  11/03/19  Yes Maximiano Coss, NP  diltiazem 2 % GEL Apply 1 application topically 4 (four) times daily.   Yes [provider]  Lidocaine-Hydrocort, Perianal, 3-0.5 % CREA Place 1 application rectally 2 (two) times daily. Patient taking differently: Place 1 application rectally daily as needed (Hemorrhoids).  10/24/19  Yes Maximiano Coss, NP  ondansetron (ZOFRAN-ODT) 4 MG disintegrating tablet Take 4 mg by mouth 4 (four) times daily as needed for nausea/vomiting. 11/10/19  Yes [provider]  oxyCODONE (OXY IR/ROXICODONE) 5 MG immediate release tablet Take 1 tablet (5 mg total) by mouth 4 (four) times daily as needed. 11/17/19  Yes Maximiano Coss, NP  escitalopram (LEXAPRO) 10 MG tablet TAKE 1 TABLET(10 MG) BY MOUTH DAILY Patient not taking: Reported on 11/13/2019 11/10/19   Maximiano Coss, NP  sildenafil (VIAGRA) 25 MG tablet Take 2 tablets (50 mg total) by mouth daily as needed for erectile dysfunction. Patient taking differently: Take 25 mg by mouth daily  as needed for erectile dysfunction.  09/19/19   Maximiano Coss, NP  Vitamin D, Ergocalciferol, (DRISDOL) 1.25 MG (50000 UNIT) CAPS capsule TAKE 1 CAPSULE BY MOUTH EVERY 7 DAYS Patient not taking: Reported on  11/13/2019 10/07/19   Maximiano Coss, NP     Family History  Problem Relation Age of Onset  . Heart disease Father     Social History   Socioeconomic History  . Marital status: Divorced    Spouse name: Not on file  . Number of children: Not on file  . Years of education: Not on file  . Highest education level: Not on file  Occupational History  . Not on file  Tobacco Use  . Smoking status: Current Every Day Smoker    Packs/day: 2.00    Years: 38.00    Pack years: 76.00    Types: Cigarettes  . Smokeless tobacco: Never Used  Substance and Sexual Activity  . Alcohol use: Yes    Alcohol/week: 1.0 standard drink    Types: 1 Standard drinks or equivalent per week    Comment: rare  . Drug use: No  . Sexual activity: Not on file  Other Topics Concern  . Not on file  Social History Narrative  . Not on file   Social Determinants of Health   Financial Resource Strain:   . Difficulty of Paying Living Expenses:   Food Insecurity:   . Worried About Charity fundraiser in the Last Year:   . Arboriculturist in the Last Year:   Transportation Needs:   . Film/video editor (Medical):   Marland Kitchen Lack of Transportation (Non-Medical):   Physical Activity:   . Days of Exercise per Week:   . Minutes of Exercise per Session:   Stress:   . Feeling of Stress :   Social Connections:   . Frequency of Communication with Friends and Family:   . Frequency of Social Gatherings with Friends and Family:   . Attends Religious Services:   . Active Member of Clubs or Organizations:   . Attends Archivist Meetings:   Marland Kitchen Marital Status:     Review of Systems: A 12 point ROS discussed and pertinent positives are indicated in the HPI above.  All other systems are negative.  Review of Systems  Constitutional: Positive for activity change, appetite change and fatigue.  Respiratory: Positive for shortness of breath. Negative for cough.   Cardiovascular: Positive for leg swelling. Negative  for chest pain.  Gastrointestinal: Positive for abdominal distention, abdominal pain and nausea. Negative for diarrhea and vomiting.  Musculoskeletal: Negative for back pain.  Neurological: Negative for headaches.    Vital Signs: BP 109/88   Pulse (!) 102   Temp 97.8 F (36.6 C) (Oral)   Resp (!) 22   Ht 5\' 11"  (1.803 m)   Wt 180 lb (81.6 kg)   SpO2 94%   BMI 25.10 kg/m   Physical Exam Constitutional:      Appearance: He is ill-appearing.     Comments: Eyes closed due to pain. Patient rates his pain 6/10 but he appears to be in significant pain.  Patient answers all questions appropriately.   HENT:     Mouth/Throat:     Mouth: Mucous membranes are dry.     Pharynx: Oropharynx is clear.  Cardiovascular:     Rate and Rhythm: Tachycardia present.     Pulses: Normal pulses.     Heart sounds: Normal heart sounds.  Pulmonary:  Effort: Pulmonary effort is normal.     Breath sounds: Rales present.     Comments: Fine expiratory crackles, bilateral lungs Abdominal:     General: Bowel sounds are increased. There is distension.     Tenderness: There is generalized abdominal tenderness.  Skin:    General: Skin is warm and dry.  Neurological:     Mental Status: He is oriented to person, place, and time.     Imaging: CT ABDOMEN PELVIS W CONTRAST  Result Date: 11/07/2019 CLINICAL DATA:  Abdominal distension EXAM: CT ABDOMEN AND PELVIS WITH CONTRAST TECHNIQUE: Multidetector CT imaging of the abdomen and pelvis was performed using the standard protocol following bolus administration of intravenous contrast. CONTRAST:  126mL ISOVUE-300 IOPAMIDOL (ISOVUE-300) INJECTION 61% COMPARISON:  None. FINDINGS: Lower chest: There is a filling defect within the LEFT atrial appendage. There is subpleural reticulation throughout the lung bases. Hepatobiliary: The liver margins are scalloped by extensive heterogeneous soft tissue/omental caking throughout the peritoneum. Differentiation between  scalloping intraperitoneal soft tissue versus intrinsic hepatic masses is difficult due to similar density. There is a hypodense lesion in the inferior RIGHT liver which measures at least 2 cm (series 2, image 39; series 3, image 60). Possible mass versus scalloping intraperitoneal soft tissue measures 7.3 cm (series 2, image 38). Gallbladder is decompressed. Pancreas: The portions of the pancreas that can be delineated are unremarkable. No definitive pancreatic mass visualized. Spleen: The margins of the spleen are scalloped by extensive omental caking. No intrasplenic mass is visualized. Adrenals/Urinary Tract: LEFT adrenal thickening. RIGHT adrenal gland is unremarkable. No hydronephrosis. The kidneys enhance symmetrically. No suspicious renal masses. The bladder is decompressed. Stomach/Bowel: The bowel is displaced and encased by a extensive omental caking. No evidence of bowel obstruction. There is mild subjective bowel wall thickening of the sigmoid. Mild wall thickening of the distal esophagus. Vascular/Lymphatic: Minimal atherosclerotic calcifications. Extensive caking throughout the abdomen and pelvis. Duplicated IVC. Reproductive: Prostate is unremarkable. Other: No ascites.  Sebaceous cyst of the midline back. Musculoskeletal: Degenerative changes of the lower lumbar spine. IMPRESSION: 1. Extensive omental caking encasing nearly all of the viscera in the abdomen and pelvis. It demonstrates mass effect on the liver and spleen without any evidence of bowel obstruction. Differential considerations include lymphoma versus is a omental caking from a intestinal primary. 2. Given extensive caking and scalloping, it is difficult to distinguish liver margins. There are multiple possible hepatic masses. Recommend attention on follow-up. 3.  Mild bowel wall thickening of the sigmoid colon, nonspecific. 4. Filling defect within the LEFT atrial appendage could reflect clot versus mixing artifact. 5. Mild esophageal  wall thickening is favored to reflect esophagitis. 6. Subpleural reticulation may reflect underlying mild pulmonary fibrosis. These results were called by telephone at the time of interpretation on 11/07/2019 at 4:14 pm to provider [facility] , who verbally acknowledged these results. Electronically Signed   By: Valentino Saxon MD   On: 11/07/2019 16:29   ECHOCARDIOGRAM COMPLETE  Result Date: 11/12/2019    ECHOCARDIOGRAM REPORT   Patient Name:   Aaron Davidson Date of Exam: 11/12/2019 Medical Rec #:  [id number]         Height:       71.0 in Accession #:    [id number]        Weight:       190.0 lb Date of Birth:  1971/02/21         BSA:          2.063 m Patient  Age:    80 years          BP:           144/91 mmHg Patient Gender: M                 HR:           103 bpm. Exam Location:  Outpatient Procedure: 2D Echo Indications:    Abdomina Pain; Possible left atrial thrombus  History:        Patient has no prior history of Echocardiogram examinations.  Sonographer:    Mikki Santee RDCS (AE) Referring Phys: [id number] [place]  1. Hypokinesis of the basal septum. Left ventricular ejection fraction, by estimation, is 60 to 65%. The left ventricle has normal function. The left ventricle has no regional wall motion abnormalities. Left ventricular diastolic parameters were normal.  2. Right ventricular systolic function is normal. The right ventricular size is normal. There is normal pulmonary artery systolic pressure.  3. The mitral valve is normal in structure. No evidence of mitral valve regurgitation. No evidence of mitral stenosis.  4. The aortic valve is tricuspid. Aortic valve regurgitation is not visualized. No aortic stenosis is present.  5. The inferior vena cava is normal in size with greater than 50% respiratory variability, suggesting right atrial pressure of 3 mmHg. FINDINGS  Left Ventricle: Hypokinesis of the basal septum. Left ventricular ejection fraction, by estimation, is  60 to 65%. The left ventricle has normal function. The left ventricle has no regional wall motion abnormalities. The left ventricular internal cavity size was normal in size. There is no left ventricular hypertrophy. Left ventricular diastolic parameters were normal. Right Ventricle: The right ventricular size is normal. No increase in right ventricular wall thickness. Right ventricular systolic function is normal. There is normal pulmonary artery systolic pressure. The tricuspid regurgitant velocity is 2.40 m/s, and  with an assumed right atrial pressure of 3 mmHg, the estimated right ventricular systolic pressure is 78.2 mmHg. Left Atrium: Left atrial size was normal in size. Right Atrium: Right atrial size was normal in size. Pericardium: There is no evidence of pericardial effusion. Mitral Valve: The mitral valve is normal in structure. Normal mobility of the mitral valve leaflets. No evidence of mitral valve regurgitation. No evidence of mitral valve stenosis. Tricuspid Valve: The tricuspid valve is normal in structure. Tricuspid valve regurgitation is trivial. No evidence of tricuspid stenosis. Aortic Valve: The aortic valve is tricuspid. Aortic valve regurgitation is not visualized. No aortic stenosis is present. Pulmonic Valve: The pulmonic valve was normal in structure. Pulmonic valve regurgitation is not visualized. No evidence of pulmonic stenosis. Aorta: The aortic root is normal in size and structure. Venous: The inferior vena cava is normal in size with greater than 50% respiratory variability, suggesting right atrial pressure of 3 mmHg. IAS/Shunts: No atrial level shunt detected by color flow Doppler.  LEFT VENTRICLE PLAX 2D LVIDd:         4.10 cm  Diastology LVIDs:         2.70 cm  LV e' lateral:   10.90 cm/s LV PW:         0.80 cm  LV E/e' lateral: 6.5 LV IVS:        0.80 cm  LV e' medial:    10.20 cm/s LVOT diam:     2.40 cm  LV E/e' medial:  6.9 LV SV:         32 LV SV Index:   15  LVOT Area:      4.52 cm  RIGHT VENTRICLE RV S prime:     14.50 cm/s TAPSE (M-mode): 1.6 cm LEFT ATRIUM           Index       RIGHT ATRIUM           Index LA diam:      3.00 cm 1.45 cm/m  RA Area:     16.40 cm LA Vol (A2C): 31.7 ml 15.37 ml/m RA Volume:   47.40 ml  22.98 ml/m LA Vol (A4C): 22.7 ml 11.00 ml/m  AORTIC VALVE LVOT Vmax:   60.80 cm/s LVOT Vmean:  36.000 cm/s LVOT VTI:    0.070 m  AORTA Ao Root diam: 3.20 cm MITRAL VALVE               TRICUSPID VALVE MV Area (PHT): 6.54 cm    TR Peak grad:   23.0 mmHg MV Decel Time: 116 msec    TR Vmax:        240.00 cm/s MV E velocity: 70.70 cm/s MV A velocity: 69.00 cm/s  SHUNTS MV E/A ratio:  1.02        Systemic VTI:  0.07 m                            Systemic Diam: 2.40 cm Skeet Latch MD Electronically signed by Skeet Latch MD Signature Date/Time: 11/12/2019/4:13:07 PM    Final     Labs:  CBC: Recent Labs    08/08/19 1404  WBC 9.7  HGB 17.5  HCT 51.3*  PLT 148*    COAGS: No results for input(s): INR, APTT in the last 8760 hours.  BMP: Recent Labs    08/08/19 1404  NA 139  K 4.9  CL 101  CO2 27  GLUCOSE 92  BUN 4*  CALCIUM 9.2  CREATININE 0.88  GFRNONAA 100  GFRAA 116    LIVER FUNCTION TESTS: Recent Labs    08/08/19 1404  BILITOT 0.4  AST 49*  ALT 41  ALKPHOS 225*  PROT 6.8  ALBUMIN 4.3    TUMOR MARKERS: No results for input(s): AFPTM, CEA, CA199, CHROMGRNA in the last 8760 hours.  Assessment and Plan:  Omental caking: Aaron Davidson, 49 year old male, presents today to the [facility] department for an image-guided peritoneal biopsy. He has experienced progressively worsening abdominal since May 2021 and CT imaging shows findings concerning for cancer.   Risks and benefits of a peritoneal biopsy were discussed with the patient and/or patient's family including, but not limited to bleeding, infection, damage to adjacent structures or low yield requiring additional tests.  All of the  questions were answered and there is agreement to proceed.  The patient has been NPO. Vitals have been reviewed. Labs are pending but will be reviewed prior to the start of the procedure. The patient does not take any blood-thinning medications. Pre-procedure IV Fentanyl 25 mcg ordered for pain.   Consent signed and in chart.  Thank you for this interesting consult.  I greatly enjoyed meeting Aaron Davidson and look forward to participating in their care.  A copy of this report was sent to the requesting provider on this date.  Electronically Signed: Soyla Dryer, AGACNP-BC [phone] 11/19/2019, 9:51 AM   I spent a total of  30 Minutes   in face to face in clinical consultation, greater than 50% of which was counseling/coordinating care for  image-guided peritoneal biopsy.

## 2019-11-20 ENCOUNTER — Other Ambulatory Visit: Payer: Self-pay | Admitting: Registered Nurse

## 2019-11-20 DIAGNOSIS — R1032 Left lower quadrant pain: Secondary | ICD-10-CM

## 2019-11-25 ENCOUNTER — Other Ambulatory Visit: Payer: Self-pay

## 2019-11-25 ENCOUNTER — Encounter: Payer: Self-pay | Admitting: Registered Nurse

## 2019-11-25 ENCOUNTER — Ambulatory Visit (INDEPENDENT_AMBULATORY_CARE_PROVIDER_SITE_OTHER): Payer: 59 | Admitting: Registered Nurse

## 2019-11-25 VITALS — BP 123/85 | HR 108 | Temp 97.6°F | Ht 71.0 in | Wt 181.0 lb

## 2019-11-25 DIAGNOSIS — R05 Cough: Secondary | ICD-10-CM | POA: Diagnosis not present

## 2019-11-25 DIAGNOSIS — M5432 Sciatica, left side: Secondary | ICD-10-CM | POA: Diagnosis not present

## 2019-11-25 DIAGNOSIS — R1114 Bilious vomiting: Secondary | ICD-10-CM

## 2019-11-25 DIAGNOSIS — R059 Cough, unspecified: Secondary | ICD-10-CM

## 2019-11-25 LAB — SURGICAL PATHOLOGY

## 2019-11-25 MED ORDER — GABAPENTIN 600 MG PO TABS: 600.0000 mg | ORAL_TABLET | Freq: Two times a day (BID) | ORAL | 0 refills | Status: AC

## 2019-11-25 MED ORDER — ONDANSETRON 4 MG PO TBDP: 4.0000 mg | ORAL_TABLET | Freq: Four times a day (QID) | ORAL | 3 refills | Status: AC | PRN

## 2019-11-25 MED ORDER — ONDANSETRON 8 MG PO TBDP: 8.0000 mg | ORAL_TABLET | Freq: Three times a day (TID) | ORAL | 0 refills | Status: AC | PRN

## 2019-11-25 NOTE — Patient Instructions (Signed)
° ° ° °  If you have lab work done today you will be contacted with your lab results within the next 2 weeks.  If you have not heard from us then please contact us. The fastest way to get your results is to register for My Chart. ° ° °IF you received an x-ray today, you will receive an invoice from [facility]. Please contact [facility] at [phone] with questions or concerns regarding your invoice.  ° °IF you received labwork today, you will receive an invoice from [facility]. Please contact [facility] at [phone] with questions or concerns regarding your invoice.  ° °Our billing staff will not be able to assist you with questions regarding bills from these companies. ° °You will be contacted with the lab results as soon as they are available. The fastest way to get your results is to activate your My Chart account. Instructions are located on the last page of this paperwork. If you have not heard from us regarding the results in 2 weeks, please contact this office. °  ° ° ° °

## 2019-11-25 NOTE — Progress Notes (Signed)
Established Patient Office Visit  Subjective:  Patient ID: Aaron Davidson, male    DOB: [birth year]  Age: 49 y.o. MRN: [id number]  CC:  Chief Complaint  Patient presents with  . left leg numbness    sharp pain near the numbness. going on for 3 weeks   . feet and ankle swelling for a week now    also with the gut swelling has his breathing labored.     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 He has obvious abdominal distension today. His concerns today are for swelling in his feet and pain/shooting pain down L leg. These have been going on for around 1 mo but worsening a lot over the previous 1-2 weeks. Worse when standing or with position changes. No relief from oxycodone.  Does note that his breathing has become incredibly labored. He is a smoker with a substantial history of tobacco use. No cough, hemoptysis, or other red flag symptoms at this time beyond rapidly increased shob and doe.  No other concerns at this time - though he is anxious about the results of his biopsy, due soon.  No past medical history on file.  Past Surgical History:  Procedure Laterality Date  . DENTAL SURGERY    . HERNIA REPAIR N/A    [employer] 09/09/2019  . INGUINAL HERNIA REPAIR Left 12/13/2015   Procedure: LAPAROSCOPIC LEFT  INGUINAL HERNIA;  Surgeon: Ralene Ok, MD;  Location: [facility];  Service: General;  Laterality: Left;  . INSERTION OF MESH Left 12/13/2015   Procedure: INSERTION OF MESH;  Surgeon: Ralene Ok, MD;  Location: [facility];  Service: General;  Laterality: Left;  . IR US GUIDE BX ASP/DRAIN  11/19/2019  . KNEE SURGERY Right   . PILONIDAL CYST EXCISION      Family History  Problem Relation Age of Onset  . Heart disease Father     Social History   Socioeconomic History  . Marital status: Divorced    Spouse name:  Not on file  . Number of children: Not on file  . Years of education: Not on file  . Highest education level: Not on file  Occupational History  . Not on file  Tobacco Use  . Smoking status: Current Some Day Smoker    Packs/day: 2.00    Years: 38.00    Pack years: 76.00    Types: Cigarettes  . Smokeless tobacco: Never Used  Substance and Sexual Activity  . Alcohol use: Yes    Alcohol/week: 1.0 standard drink    Types: 1 Standard drinks or equivalent per week    Comment: rare  . Drug use: No  . Sexual activity: Not on file  Other Topics Concern  . Not on file  Social History Narrative  . Not on file   Social Determinants of Health   Financial Resource Strain:   . Difficulty of Paying Living Expenses: Not on file  Food Insecurity:   . Worried About Charity fundraiser in the Last Year: Not on file  . Ran Out of Food in the Last Year: Not on file  Transportation Needs:   . Lack of Transportation (Medical): Not on file  . Lack of Transportation (Non-Medical): Not on file  Physical Activity:   . Days of Exercise per Week: Not on file  . Minutes of Exercise per Session:  Not on file  Stress:   . Feeling of Stress : Not on file  Social Connections:   . Frequency of Communication with Friends and Family: Not on file  . Frequency of Social Gatherings with Friends and Family: Not on file  . Attends Religious Services: Not on file  . Active Member of Clubs or Organizations: Not on file  . Attends Archivist Meetings: Not on file  . Marital Status: Not on file  Intimate Partner Violence:   . Fear of Current or Ex-Partner: Not on file  . Emotionally Abused: Not on file  . Physically Abused: Not on file  . Sexually Abused: Not on file    Outpatient Medications Prior to Visit  Medication Sig Dispense Refill  . ALPRAZolam (XANAX) 0.25 MG tablet TAKE 1 TABLET(0.25 MG) BY MOUTH AT BEDTIME AS NEEDED FOR ANXIETY. (Patient taking differently: Take 0.25 mg by mouth at  bedtime. ) 30 tablet 0  . dicyclomine (BENTYL) 10 MG capsule TAKE 1 CAPSULE(10 MG) BY MOUTH FOUR TIMES DAILY BEFORE MEALS AND AT BEDTIME 45 capsule 0  . diltiazem 2 % GEL Apply 1 application topically 4 (four) times daily.    . Lidocaine-Hydrocort, Perianal, 3-0.5 % CREA Place 1 application rectally 2 (two) times daily. (Patient taking differently: Place 1 application rectally daily as needed (Hemorrhoids). ) 28.3 g 5  . oxyCODONE (OXY IR/ROXICODONE) 5 MG immediate release tablet Take 1 tablet (5 mg total) by mouth 4 (four) times daily as needed. 40 tablet 0  . sildenafil (VIAGRA) 25 MG tablet Take 2 tablets (50 mg total) by mouth daily as needed for erectile dysfunction. (Patient taking differently: Take 25 mg by mouth daily as needed for erectile dysfunction. ) 30 tablet 3  . ondansetron (ZOFRAN-ODT) 4 MG disintegrating tablet Take 4 mg by mouth 4 (four) times daily as needed for nausea/vomiting.     No facility-administered medications prior to visit.    Allergies  Allergen Reactions  . No Known Allergies     ROS Review of Systems  Constitutional: Positive for fatigue and unexpected weight change. Negative for activity change, appetite change, chills, diaphoresis and fever.  HENT: Negative.   Eyes: Negative.   Respiratory: Positive for chest tightness and shortness of breath.   Cardiovascular: Negative.   Gastrointestinal: Positive for abdominal distention, abdominal pain, constipation, diarrhea, nausea and rectal pain.  Endocrine: Negative.   Genitourinary: Negative.   Musculoskeletal: Positive for myalgias.  Skin: Negative.   Allergic/Immunologic: Negative.   Neurological: Negative.   Hematological: Negative.   Psychiatric/Behavioral: Negative for agitation, behavioral problems, confusion, decreased concentration, dysphoric mood, hallucinations, self-injury, sleep disturbance and suicidal ideas. The patient is nervous/anxious. The patient is not hyperactive.       Objective:      Physical Exam Vitals and nursing note reviewed.  Constitutional:      General: He is not in acute distress.    Appearance: Normal appearance. He is normal weight. He is ill-appearing. He is not toxic-appearing or diaphoretic.  Cardiovascular:     Rate and Rhythm: Normal rate and regular rhythm.     Heart sounds: Normal heart sounds. No murmur heard.  No friction rub. No gallop.   Pulmonary:     Effort: Pulmonary effort is normal. No respiratory distress.     Breath sounds: Normal breath sounds.  Abdominal:     General: There is distension.     Palpations: There is mass.     Tenderness: There is abdominal tenderness.  Skin:  General: Skin is warm and dry.     Capillary Refill: Capillary refill takes 2 to 3 seconds.     Coloration: Skin is not jaundiced or pale.     Findings: No bruising, erythema, lesion or rash.  Neurological:     General: No focal deficit present.     Mental Status: He is alert and oriented to person, place, and time. Mental status is at baseline.  Psychiatric:        Mood and Affect: Mood normal.        Behavior: Behavior normal.        Thought Content: Thought content normal.        Judgment: Judgment normal.     BP 123/85   Pulse (!) 108   Temp 97.6 F (36.4 C) (Temporal)   Ht 5\' 11"  (1.803 m)   Wt 181 lb (82.1 kg)   SpO2 96%   BMI 25.24 kg/m  Wt Readings from Last 3 Encounters:  11/25/19 181 lb (82.1 kg)  11/19/19 180 lb (81.6 kg)  08/08/19 190 lb (86.2 kg)     Health Maintenance Due  Topic Date Due  . Hepatitis C Screening  Never done  . INFLUENZA VACCINE  11/02/2019    There are no preventive care reminders to display for this patient.  Lab Results  Component Value Date   TSH 1.360 08/08/2019   Lab Results  Component Value Date   WBC 9.5 11/19/2019   HGB 17.0 11/19/2019   HCT 50.8 11/19/2019   MCV 96.9 11/19/2019   PLT 203 11/19/2019   Lab Results  Component Value Date   NA 139 08/08/2019   K 4.9 08/08/2019   CO2  27 08/08/2019   GLUCOSE 92 08/08/2019   BUN 4 (L) 08/08/2019   CREATININE 0.88 08/08/2019   BILITOT 0.4 08/08/2019   ALKPHOS 225 (H) 08/08/2019   AST 49 (H) 08/08/2019   ALT 41 08/08/2019   PROT 6.8 08/08/2019   ALBUMIN 4.3 08/08/2019   CALCIUM 9.2 08/08/2019   Lab Results  Component Value Date   CHOL 161 08/08/2019   Lab Results  Component Value Date   HDL 77 08/08/2019   Lab Results  Component Value Date   LDLCALC 69 08/08/2019   Lab Results  Component Value Date   TRIG 80 08/08/2019   Lab Results  Component Value Date   CHOLHDL 2.1 08/08/2019   Lab Results  Component Value Date   HGBA1C 5.1 08/08/2019      Assessment & Plan:   Problem List Items Addressed This Visit    None    Visit Diagnoses    Bilious vomiting with nausea    -  Primary   Relevant Medications   ondansetron (ZOFRAN-ODT) 8 MG disintegrating tablet   ondansetron (ZOFRAN-ODT) 4 MG disintegrating tablet   Left sciatic nerve pain       Relevant Medications   gabapentin (NEURONTIN) 600 MG tablet   Cough       Relevant Orders   CT Chest Wo Contrast      Meds ordered this encounter  Medications  . ondansetron (ZOFRAN-ODT) 8 MG disintegrating tablet    Sig: Take 1 tablet (8 mg total) by mouth every 8 (eight) hours as needed for nausea or vomiting.    Dispense:  20 tablet    Refill:  0    Order Specific Question:   Supervising Provider    Answer:   Carlota Raspberry, JEFFREY R [[id number]]  . ondansetron (ZOFRAN-ODT) 4  MG disintegrating tablet    Sig: Take 1 tablet (4 mg total) by mouth 4 (four) times daily as needed.    Dispense:  40 tablet    Refill:  3    Order Specific Question:   Supervising Provider    Answer:   Carlota Raspberry, JEFFREY R [[id number]]  . gabapentin (NEURONTIN) 600 MG tablet    Sig: Take 1 tablet (600 mg total) by mouth 2 (two) times daily.    Dispense:  90 tablet    Refill:  0    Order Specific Question:   Supervising Provider    Answer:   Carlota Raspberry, JEFFREY R [[id number]]    Follow-up: No  follow-ups on file.   PLAN  Refill ondansetron  Start gabapentin 600mg  PO bid for shooting pain down legs  Continue oxycodone for pain management  Pursue onc referral when biopsy results available  Discussed with patient the process of applying for disability - I am sure he would qualify  CT chest given rapidly worsening shob - fear for lung mets  Patient encouraged to call clinic with any questions, comments, or concerns.  Maximiano Coss, NP

## 2019-11-27 ENCOUNTER — Encounter: Payer: Self-pay | Admitting: Registered Nurse

## 2019-11-27 ENCOUNTER — Other Ambulatory Visit: Payer: Self-pay

## 2019-11-27 ENCOUNTER — Inpatient Hospital Stay: Payer: 59

## 2019-11-27 ENCOUNTER — Inpatient Hospital Stay: Payer: 59 | Attending: Hematology and Oncology | Admitting: Hematology and Oncology

## 2019-11-27 VITALS — BP 118/82 | HR 108 | Temp 97.6°F | Wt 181.2 lb

## 2019-11-27 DIAGNOSIS — Z803 Family history of malignant neoplasm of breast: Secondary | ICD-10-CM | POA: Insufficient documentation

## 2019-11-27 DIAGNOSIS — Z87891 Personal history of nicotine dependence: Secondary | ICD-10-CM | POA: Diagnosis not present

## 2019-11-27 DIAGNOSIS — Z79899 Other long term (current) drug therapy: Secondary | ICD-10-CM | POA: Insufficient documentation

## 2019-11-27 DIAGNOSIS — Z8042 Family history of malignant neoplasm of prostate: Secondary | ICD-10-CM

## 2019-11-27 DIAGNOSIS — Z7189 Other specified counseling: Secondary | ICD-10-CM

## 2019-11-27 DIAGNOSIS — M79606 Pain in leg, unspecified: Secondary | ICD-10-CM | POA: Diagnosis not present

## 2019-11-27 DIAGNOSIS — K649 Unspecified hemorrhoids: Secondary | ICD-10-CM | POA: Diagnosis not present

## 2019-11-27 DIAGNOSIS — C649 Malignant neoplasm of unspecified kidney, except renal pelvis: Secondary | ICD-10-CM

## 2019-11-27 DIAGNOSIS — C786 Secondary malignant neoplasm of retroperitoneum and peritoneum: Secondary | ICD-10-CM | POA: Insufficient documentation

## 2019-11-27 DIAGNOSIS — R1032 Left lower quadrant pain: Secondary | ICD-10-CM

## 2019-11-27 LAB — CMP (CANCER CENTER ONLY)
ALT: 9 U/L (ref 0–44)
AST: 33 U/L (ref 15–41)
Albumin: 2.9 g/dL — ABNORMAL LOW (ref 3.5–5.0)
Alkaline Phosphatase: 3575 U/L — ABNORMAL HIGH (ref 38–126)
Anion gap: 9 (ref 5–15)
BUN: 11 mg/dL (ref 6–20)
CO2: 27 mmol/L (ref 22–32)
Calcium: 9.7 mg/dL (ref 8.9–10.3)
Chloride: 97 mmol/L — ABNORMAL LOW (ref 98–111)
Creatinine: 0.98 mg/dL (ref 0.61–1.24)
GFR, Est AFR Am: >60 mL/min (ref >60)
GFR, Estimated: >60 mL/min (ref >60)
Glucose, Bld: 94 mg/dL (ref 70–99)
Potassium: 5.1 mmol/L (ref 3.5–5.1)
Sodium: 133 mmol/L — ABNORMAL LOW (ref 135–145)
Total Bilirubin: 0.4 mg/dL (ref 0.3–1.2)
Total Protein: 6.7 g/dL (ref 6.5–8.1)

## 2019-11-27 LAB — CBC WITH DIFFERENTIAL (CANCER CENTER ONLY)
Abs Immature Granulocytes: 0.04 10*3/uL (ref 0.00–0.07)
Basophils Absolute: 0 10*3/uL (ref 0.0–0.1)
Basophils Relative: 1 %
Eosinophils Absolute: 0.1 10*3/uL (ref 0.0–0.5)
Eosinophils Relative: 1 %
HCT: 51.3 % (ref 39.0–52.0)
Hemoglobin: 17.7 g/dL — ABNORMAL HIGH (ref 13.0–17.0)
Immature Granulocytes: 1 %
Lymphocytes Relative: 12 %
Lymphs Abs: 1.1 10*3/uL (ref 0.7–4.0)
MCH: 33.2 pg (ref 26.0–34.0)
MCHC: 34.5 g/dL (ref 30.0–36.0)
MCV: 96.2 fL (ref 80.0–100.0)
Monocytes Absolute: 0.7 10*3/uL (ref 0.1–1.0)
Monocytes Relative: 8 %
Neutro Abs: 6.8 10*3/uL (ref 1.7–7.7)
Neutrophils Relative %: 77 %
Platelet Count: 182 10*3/uL (ref 150–400)
RBC: 5.33 MIL/uL (ref 4.22–5.81)
RDW: 12.9 % (ref 11.5–15.5)
WBC Count: 8.7 10*3/uL (ref 4.0–10.5)
nRBC: 0 % (ref 0.0–0.2)

## 2019-11-27 LAB — LACTATE DEHYDROGENASE: LDH: 282 U/L — ABNORMAL HIGH (ref 98–192)

## 2019-11-27 LAB — SEDIMENTATION RATE: Sed Rate: 19 mm/hr — ABNORMAL HIGH (ref 0–16)

## 2019-11-27 MED ORDER — OXYCODONE HCL 5 MG PO TABS: 5.0000 mg | ORAL_TABLET | Freq: Four times a day (QID) | ORAL | 0 refills | Status: DC | PRN

## 2019-11-27 NOTE — Progress Notes (Signed)
[place] [phone]   Fax:([phone]) [place] NOTE  Patient Care Team: Maximiano Coss, NP as PCP - General (Adult Health Nurse Practitioner)  Hematological/Oncological History # Metastatic Clear Cell Carcinoma, Likely of Kidney Origin 1) 08/08/2019: presented to PCP with abdominal cramping.  2) 11/07/2019: CT abdomen pelvis performed which revealed extensive omental caking encasing nearly all of the viscera in the abdomen and pelvis. It demonstrates mass effect on the liver and spleen without any evidence of bowel obstruction 3) 11/19/2019: biopsy performed of omental mass, findings shows carcinoma with clear cell features consistent with primary renal cell carcinoma 4) 11/27/2019: establish care with Dr. Lorenso Courier   CHIEF COMPLAINTS/PURPOSE OF CONSULTATION:  "metastatic clear cell cancer "  HISTORY OF PRESENTING ILLNESS:  Aaron Davidson 49 y.o. male with no significant past medical history who presents for evaluation of newly diagnosed clear cell cancer (most consistent with metastatic RCC).   On review of the previous records Aaron Davidson initially presented to his primary care provider in May 2021 with abdominal pain and cramping.  After series of evaluations on 11/07/2019 the patient underwent CT abdomen pelvis which revealed extensive omental caking encasing nearly all of the viscera in the abdomen and pelvis.  The demonstrated mass-effect on the liver and spleen without any evidence of bowel obstruction.  On 11/19/2019 the patient had a biopsy performed of the omental mass with findings consistent with clear cell renal cell carcinoma.  The patient was referred to oncology for further evaluation and management at that time.  On exam today Aaron Davidson is accompanied by his mother.  He notes that he has been having pain in his abdomen for many years.  He reports that it all began with a hernia operation he had in approximately 20 16-20 17.  He reports that  initially it felt good for about 2 weeks, but then he developed pain in his lower got which persisted for 4 to 5 years.  He notes that over the last 3 to 4 months that pain has "gone into overdrive".  Admit associated with distention of the abdomen.  He notes he is also been struggling with hemorrhoids during this time.  On further discussion Aaron Davidson notes that he uses topical agents to treat his hemorrhoids, but they are occasionally so flared that he is unable to sit.  He also notes that they are quite prone to bleeding.  He notes that he has no family history remarkable for renal cell cancer, though he did have a maternal aunt with breast cancer in a maternalgrandfather with prostate cancer.  He reports that his father died of heart disease.  He notes that he was a smoker but quit in December 2019.  He is not currently drinking any alcohol.  He denies having any issues with fevers, chills, sweats, though he does endorse having some problems with nausea and vomiting.  He is having pain in his leg for which he is currently being treated with gabapentin.  A full 10 point ROS is listed below.  MEDICAL HISTORY:  History reviewed. No pertinent past medical history.  SURGICAL HISTORY: Past Surgical History:  Procedure Laterality Date   DENTAL SURGERY     HERNIA REPAIR N/A    [employer] 09/09/2019   INGUINAL HERNIA REPAIR Left 12/13/2015   Procedure: LAPAROSCOPIC LEFT  INGUINAL HERNIA;  Surgeon: Ralene Ok, MD;  Location: [facility];  Service: General;  Laterality: Left;   INSERTION OF MESH Left 12/13/2015   Procedure: INSERTION  OF MESH;  Surgeon: Ralene Ok, MD;  Location: [facility];  Service: General;  Laterality: Left;   IR US GUIDE BX ASP/DRAIN  11/19/2019   KNEE SURGERY Right    PILONIDAL CYST EXCISION      SOCIAL HISTORY: Social History   Socioeconomic History   Marital status: Divorced    Spouse name: Not on file   Number of children: Not on file   Years of education: Not on  file   Highest education level: Not on file  Occupational History   Not on file  Tobacco Use   Smoking status: Current Some Day Smoker    Packs/day: 2.00    Years: 38.00    Pack years: 76.00    Types: Cigarettes   Smokeless tobacco: Never Used  Substance and Sexual Activity   Alcohol use: Yes    Alcohol/week: 1.0 standard drink    Types: 1 Standard drinks or equivalent per week    Comment: rare   Drug use: No   Sexual activity: Not on file  Other Topics Concern   Not on file  Social History Narrative   Not on file   Social Determinants of Health   Financial Resource Strain:    Difficulty of Paying Living Expenses: Not on file  Food Insecurity:    Worried About [place] in the Last Year: Not on file   Ran Out of Food in the Last Year: Not on file  Transportation Needs:    Lack of Transportation (Medical): Not on file   Lack of Transportation (Non-Medical): Not on file  Physical Activity:    Days of Exercise per Week: Not on file   Minutes of Exercise per Session: Not on file  Stress:    Feeling of Stress : Not on file  Social Connections:    Frequency of Communication with Friends and Family: Not on file   Frequency of Social Gatherings with Friends and Family: Not on file   Attends Religious Services: Not on file   Active Member of Clubs or Organizations: Not on file   Attends Archivist Meetings: Not on file   Marital Status: Not on file  Intimate Partner Violence:    Fear of Current or Ex-Partner: Not on file   Emotionally Abused: Not on file   Physically Abused: Not on file   Sexually Abused: Not on file    FAMILY HISTORY: Family History  Problem Relation Age of Onset   Heart disease Father     ALLERGIES:  is allergic to no known allergies.  MEDICATIONS:  Current Outpatient Medications  Medication Sig Dispense Refill   docusate sodium (COLACE) 100 MG capsule Take 100 mg by mouth daily as needed for  mild constipation.     ALPRAZolam (XANAX) 0.25 MG tablet TAKE 1 TABLET(0.25 MG) BY MOUTH AT BEDTIME AS NEEDED FOR ANXIETY. (Patient taking differently: Take 0.25 mg by mouth at bedtime. ) 30 tablet 0   axitinib (INLYTA) 5 MG tablet Take 1 tablet (5 mg total) by mouth every 12 (twelve) hours. 60 tablet 0   dicyclomine (BENTYL) 10 MG capsule TAKE 1 CAPSULE(10 MG) BY MOUTH FOUR TIMES DAILY BEFORE MEALS AND AT BEDTIME 45 capsule 0   diltiazem 2 % GEL Apply 1 application topically 4 (four) times daily.     gabapentin (NEURONTIN) 600 MG tablet Take 1 tablet (600 mg total) by mouth 2 (two) times daily. 90 tablet 0   Lidocaine-Hydrocort, Perianal, 3-0.5 % CREA Place 1 application  rectally 2 (two) times daily. (Patient taking differently: Place 1 application rectally daily as needed (Hemorrhoids). ) 28.3 g 5   ondansetron (ZOFRAN-ODT) 4 MG disintegrating tablet Take 1 tablet (4 mg total) by mouth 4 (four) times daily as needed. 40 tablet 3   ondansetron (ZOFRAN-ODT) 8 MG disintegrating tablet Take 1 tablet (8 mg total) by mouth every 8 (eight) hours as needed for nausea or vomiting. 20 tablet 0   oxyCODONE (OXY IR/ROXICODONE) 5 MG immediate release tablet Take 1 tablet (5 mg total) by mouth 4 (four) times daily as needed. 40 tablet 0   prochlorperazine (COMPAZINE) 10 MG tablet Take 1 tablet (10 mg total) by mouth every 6 (six) hours as needed for nausea or vomiting. 30 tablet 0   sildenafil (VIAGRA) 25 MG tablet Take 2 tablets (50 mg total) by mouth daily as needed for erectile dysfunction. (Patient taking differently: Take 25 mg by mouth daily as needed for erectile dysfunction. ) 30 tablet 3   No current facility-administered medications for this visit.    REVIEW OF SYSTEMS:   Constitutional: ( - ) fevers, ( - )  chills , ( - ) night sweats Eyes: ( - ) blurriness of vision, ( - ) double vision, ( - ) watery eyes Ears, nose, mouth, throat, and face: ( - ) mucositis, ( - ) sore  throat Respiratory: ( - ) cough, ( - ) dyspnea, ( - ) wheezes Cardiovascular: ( - ) palpitation, ( - ) chest discomfort, ( - ) lower extremity swelling Gastrointestinal:  ( - ) nausea, ( - ) heartburn, ( - ) change in bowel habits Skin: ( - ) abnormal skin rashes Lymphatics: ( - ) new lymphadenopathy, ( - ) easy bruising Neurological: ( - ) numbness, ( - ) tingling, ( - ) new weaknesses Behavioral/Psych: ( - ) mood change, ( - ) new changes  All other systems were reviewed with the patient and are negative.  PHYSICAL EXAMINATION: ECOG PERFORMANCE STATUS: 1 - Symptomatic but completely ambulatory  Vitals:   11/27/19 1400  BP: 118/82  Pulse: (!) 108  Temp: 97.6 F (36.4 C)  SpO2: 98%   Filed Weights   11/27/19 1400  Weight: 181 lb 4 oz (82.2 kg)    GENERAL: well appearing middle aged Caucasian male in NAD  SKIN: skin color, texture, turgor are normal, no rashes or significant lesions EYES: conjunctiva are pink and non-injected, sclera clear LUNGS: clear to auscultation and percussion with normal breathing effort HEART: regular rate & rhythm and no murmurs and no lower extremity edema ABDOMEN: tense and distended with minimal tenderness Musculoskeletal: no cyanosis of digits and no clubbing  PSYCH: alert & oriented x 3, fluent speech NEURO: no focal motor/sensory deficits  LABORATORY DATA:  I have reviewed the data as listed CBC Latest Ref Rng & Units 11/27/2019 11/19/2019 08/08/2019  WBC 4.0 - 10.5 K/uL 8.7 9.5 9.7  Hemoglobin 13.0 - 17.0 g/dL 17.7(H) 17.0 17.5  Hematocrit 39 - 52 % 51.3 50.8 51.3(H)  Platelets 150 - 400 K/uL 182 203 148(L)    CMP Latest Ref Rng & Units 11/27/2019 08/08/2019 05/06/2009  Glucose 70 - 99 mg/dL 94 92 105(H)  BUN 6 - 20 mg/dL 11 4(L) 6  Creatinine 0.61 - 1.24 mg/dL 0.98 0.88 0.97  Sodium 135 - 145 mmol/L 133(L) 139 135  Potassium 3.5 - 5.1 mmol/L 5.1 4.9 4.1  Chloride 98 - 111 mmol/L 97(L) 101 102  CO2 22 - 32 mmol/L 27 27  27  Calcium 8.9 - 10.3  mg/dL 9.7 9.2 8.8  Total Protein 6.5 - 8.1 g/dL 6.7 6.8 -  Total Bilirubin 0.3 - 1.2 mg/dL 0.4 0.4 -  Alkaline Phos 38 - 126 U/L 3,575(H) 225(H) -  AST 15 - 41 U/L 33 49(H) -  ALT 0 - 44 U/L 9 41 -     PATHOLOGY: SURGICAL PATHOLOGY  CASE: [id number]  PATIENT: Aaron Davidson  Surgical Pathology Report   Clinical History: omental malignancy, lymphoma vs carcinoma (cm)   FINAL MICROSCOPIC DIAGNOSIS:   A. PERITONEAL/OMENTUM, NEEDLE CORE BIOPSY:  - Carcinoma with clear cell features  - See comment   COMMENT:   The neoplastic cells are positive for cytokeratin 7, CD10 and Pax-8 but  negative for Arginase, CDX2, cytokeratin 20, glypican-3, HepPar, PSA,  prostein, TTF-1, P504S, calretinin, cytokeratin 5/6 (patchy) and D2-40.  The differential diagnosis includes primary renal cell carcinoma;  clinical correlation is recommended. Dr. Kae Heller was notified of these  results on November 25, 2019.   Dr. Vic Ripper reviewed the case and agrees with the above diagnosis.    GROSS DESCRIPTION:   Received in saline are several fragmented cores of tan-pink soft tissue  ranging from 0.2 to 1.5 cm. 1 core is placed in RPMI for possible flow  cytometry. Remaining specimen submitted in 1 cassette.(AK 11/19/2019)    Final Diagnosis performed by Thressa Sheller, MD.  Electronically signed  11/25/2019  Technical and / or Professional components performed at [facility]. [facility], [place] [address] [place], [place] [address].  Immunohistochemistry Technical component (if applicable) was performed  at [facility]. [address] [place],  [place], [place] [address].  IMMUNOHISTOCHEMISTRY DISCLAIMER (if applicable):  Some of these immunohistochemical stains may have been developed and the  performance characteristics determine by [facility]. Some  may not have been cleared or approved by the U.S. Food and Drug  Administration. The FDA has determined  that such clearance or approval  is not necessary. This test is used for clinical purposes. It should not  be regarded as investigational or for research. This laboratory is  certified under the [place]  (CLIA-88) as qualified to perform high complexityclinical laboratory  testing. The controls stained appropriately.    RADIOGRAPHIC STUDIES: I have personally reviewed the radiological images as listed and agreed with the findings in the report: omental caking and metastatic spread to liver.   CT ABDOMEN PELVIS W CONTRAST  Result Date: 11/07/2019 CLINICAL DATA:  Abdominal distension EXAM: CT ABDOMEN AND PELVIS WITH CONTRAST TECHNIQUE: Multidetector CT imaging of the abdomen and pelvis was performed using the standard protocol following bolus administration of intravenous contrast. CONTRAST:  151mL ISOVUE-300 IOPAMIDOL (ISOVUE-300) INJECTION 61% COMPARISON:  None. FINDINGS: Lower chest: There is a filling defect within the LEFT atrial appendage. There is subpleural reticulation throughout the lung bases. Hepatobiliary: The liver margins are scalloped by extensive heterogeneous soft tissue/omental caking throughout the peritoneum. Differentiation between scalloping intraperitoneal soft tissue versus intrinsic hepatic masses is difficult due to similar density. There is a hypodense lesion in the inferior RIGHT liver which measures at least 2 cm (series 2, image 39; series 3, image 60). Possible mass versus scalloping intraperitoneal soft tissue measures 7.3 cm (series 2, image 38). Gallbladder is decompressed. Pancreas: The portions of the pancreas that can be delineated are unremarkable. No definitive pancreatic mass visualized. Spleen: The margins of the spleen are scalloped by extensive omental caking. No intrasplenic mass  is visualized. Adrenals/Urinary Tract: LEFT adrenal thickening. RIGHT adrenal gland is unremarkable. No hydronephrosis. The kidneys enhance  symmetrically. No suspicious renal masses. The bladder is decompressed. Stomach/Bowel: The bowel is displaced and encased by a extensive omental caking. No evidence of bowel obstruction. There is mild subjective bowel wall thickening of the sigmoid. Mild wall thickening of the distal esophagus. Vascular/Lymphatic: Minimal atherosclerotic calcifications. Extensive caking throughout the abdomen and pelvis. Duplicated IVC. Reproductive: Prostate is unremarkable. Other: No ascites.  Sebaceous cyst of the midline back. Musculoskeletal: Degenerative changes of the lower lumbar spine. IMPRESSION: 1. Extensive omental caking encasing nearly all of the viscera in the abdomen and pelvis. It demonstrates mass effect on the liver and spleen without any evidence of bowel obstruction. Differential considerations include lymphoma versus is a omental caking from a intestinal primary. 2. Given extensive caking and scalloping, it is difficult to distinguish liver margins. There are multiple possible hepatic masses. Recommend attention on follow-up. 3.  Mild bowel wall thickening of the sigmoid colon, nonspecific. 4. Filling defect within the LEFT atrial appendage could reflect clot versus mixing artifact. 5. Mild esophageal wall thickening is favored to reflect esophagitis. 6. Subpleural reticulation may reflect underlying mild pulmonary fibrosis. These results were called by telephone at the time of interpretation on 11/07/2019 at 4:14 pm to provider [facility] , who verbally acknowledged these results. Electronically Signed   By: Valentino Saxon MD   On: 11/07/2019 16:29   IR US Guide Bx Asp/Drain  Result Date: 11/19/2019 INDICATION: Diffuse omental masses, concern versus carcinoma EXAM: Ultrasound midline omental mass core biopsy MEDICATIONS: 1% lidocaine ANESTHESIA/SEDATION: Moderate (conscious) sedation was employed during this procedure. A total of Versed 1.0 mg and Fentanyl 70 mcg was administered intravenously.  Moderate Sedation Time: 11 minutes. The patient's level of consciousness and vital signs were monitored continuously by radiology nursing throughout the procedure under my direct supervision. FLUOROSCOPY TIME:  Fluoroscopy Time: None. COMPLICATIONS: None immediate. PROCEDURE: Informed written consent was obtained from the patient after a thorough discussion of the procedural risks, benefits and alternatives. All questions were addressed. Maximal Sterile Barrier Technique was utilized including caps, mask, sterile gowns, sterile gloves, sterile drape, hand hygiene and skin antiseptic. A timeout was performed prior to the initiation of the procedure. Previous imaging reviewed. Preliminary ultrasound performed. A midline subhepatic omental mass was localized and marked for an epigastric approach. This was correlated with the CT. Under sterile conditions and local anesthesia, a 17 gauge guide needle was advanced to the midline omental mass. Needle position confirmed with ultrasound. Images obtained for documentation. 18 gauge core biopsies obtained through the access. Samples were intact and non fragmented. Samples placed in saline. Needle tract occluded with Gel-Foam. Postprocedure imaging demonstrates no hemorrhage or hematoma. Patient tolerated biopsy well. IMPRESSION: Successful ultrasound midline omental mass 18 gauge core biopsies. Electronically Signed   By: Jerilynn Mages.  Shick M.D.   On: 11/19/2019 12:50   ECHOCARDIOGRAM COMPLETE  Result Date: 11/12/2019    ECHOCARDIOGRAM REPORT   Patient Name:   Aaron Davidson Date of Exam: 11/12/2019 Medical Rec #:  [id number]         Height:       71.0 in Accession #:    [id number]        Weight:       190.0 lb Date of Birth:  [birth year]         BSA:          2.063 m Patient Age:    65 years  BP:           144/91 mmHg Patient Gender: M                 HR:           103 bpm. Exam Location:  Outpatient Procedure: 2D Echo Indications:    Abdomina Pain; Possible left atrial  thrombus  History:        Patient has no prior history of Echocardiogram examinations.  Sonographer:    Mikki Santee RDCS (AE) Referring Phys: [id number] [place]  1. Hypokinesis of the basal septum. Left ventricular ejection fraction, by estimation, is 60 to 65%. The left ventricle has normal function. The left ventricle has no regional wall motion abnormalities. Left ventricular diastolic parameters were normal.  2. Right ventricular systolic function is normal. The right ventricular size is normal. There is normal pulmonary artery systolic pressure.  3. The mitral valve is normal in structure. No evidence of mitral valve regurgitation. No evidence of mitral stenosis.  4. The aortic valve is tricuspid. Aortic valve regurgitation is not visualized. No aortic stenosis is present.  5. The inferior vena cava is normal in size with greater than 50% respiratory variability, suggesting right atrial pressure of 3 mmHg. FINDINGS  Left Ventricle: Hypokinesis of the basal septum. Left ventricular ejection fraction, by estimation, is 60 to 65%. The left ventricle has normal function. The left ventricle has no regional wall motion abnormalities. The left ventricular internal cavity size was normal in size. There is no left ventricular hypertrophy. Left ventricular diastolic parameters were normal. Right Ventricle: The right ventricular size is normal. No increase in right ventricular wall thickness. Right ventricular systolic function is normal. There is normal pulmonary artery systolic pressure. The tricuspid regurgitant velocity is 2.40 m/s, and  with an assumed right atrial pressure of 3 mmHg, the estimated right ventricular systolic pressure is 01.0 mmHg. Left Atrium: Left atrial size was normal in size. Right Atrium: Right atrial size was normal in size. Pericardium: There is no evidence of pericardial effusion. Mitral Valve: The mitral valve is normal in structure. Normal mobility of the mitral  valve leaflets. No evidence of mitral valve regurgitation. No evidence of mitral valve stenosis. Tricuspid Valve: The tricuspid valve is normal in structure. Tricuspid valve regurgitation is trivial. No evidence of tricuspid stenosis. Aortic Valve: The aortic valve is tricuspid. Aortic valve regurgitation is not visualized. No aortic stenosis is present. Pulmonic Valve: The pulmonic valve was normal in structure. Pulmonic valve regurgitation is not visualized. No evidence of pulmonic stenosis. Aorta: The aortic root is normal in size and structure. Venous: The inferior vena cava is normal in size with greater than 50% respiratory variability, suggesting right atrial pressure of 3 mmHg. IAS/Shunts: No atrial level shunt detected by color flow Doppler.  LEFT VENTRICLE PLAX 2D LVIDd:         4.10 cm  Diastology LVIDs:         2.70 cm  LV e' lateral:   10.90 cm/s LV PW:         0.80 cm  LV E/e' lateral: 6.5 LV IVS:        0.80 cm  LV e' medial:    10.20 cm/s LVOT diam:     2.40 cm  LV E/e' medial:  6.9 LV SV:         32 LV SV Index:   15 LVOT Area:     4.52 cm  RIGHT VENTRICLE RV S prime:  14.50 cm/s TAPSE (M-mode): 1.6 cm LEFT ATRIUM           Index       RIGHT ATRIUM           Index LA diam:      3.00 cm 1.45 cm/m  RA Area:     16.40 cm LA Vol (A2C): 31.7 ml 15.37 ml/m RA Volume:   47.40 ml  22.98 ml/m LA Vol (A4C): 22.7 ml 11.00 ml/m  AORTIC VALVE LVOT Vmax:   60.80 cm/s LVOT Vmean:  36.000 cm/s LVOT VTI:    0.070 m  AORTA Ao Root diam: 3.20 cm MITRAL VALVE               TRICUSPID VALVE MV Area (PHT): 6.54 cm    TR Peak grad:   23.0 mmHg MV Decel Time: 116 msec    TR Vmax:        240.00 cm/s MV E velocity: 70.70 cm/s MV A velocity: 69.00 cm/s  SHUNTS MV E/A ratio:  1.02        Systemic VTI:  0.07 m                            Systemic Diam: 2.40 cm Skeet Latch MD Electronically signed by Skeet Latch MD Signature Date/Time: 11/12/2019/4:13:07 PM    Final     ASSESSMENT & PLAN Aaron Davidson 49  y.o. male with no significant past medical history who presents for evaluation of newly diagnosed clear cell cancer (most consistent with metastatic RCC).  After review the labs, review the imaging, discussion with the patient the findings are most consistent with a metastatic clear-cell carcinoma of renal origin.  As such we will treat this as metastatic RCC with plans to begin with pembrolizumab and axitinib.  Prior to the start of therapy we will need to complete staging with an MRI of the brain as well as CT scan of the chest.  Assuming we are able to get all his work-up done promptly we will plan to start therapy in approximately 12/11/2019.  Today the bulk of our discussion focused on the diagnosis of RCC and the poor prognosis of metastatic disease.  I noted that all treatments moving forward will be palliative in nature and designed to shrink the tumor and prolong life, but that there is no curative therapy available for this cancer.  The patient and his mother voiced their understanding of these unfortunate circumstances moving forward.  # Metastatic Clear Cell Carcinoma, Likely of Kidney Origin --radiographic imaging and pathology is most consistent with the spread of metastatic RCC. Will plan to proceed treating like this is RCC --complete staging with CT chest (scheduled for 12/10/2019) and MRI brain (to be scheduled)  --plan to proceed with pembrolizumab/axitinib as 1st line therapy for metastatic RCC.  --if brain mets are present on MRI will hold therapy for evaluation by Rad/Onc --plan to staging to be complete and therapy to start on approximately 12/11/2019   #Markedly Enlarged Hemorrhoids --recommend referral to general surgery for consideration of hemorrhoidal banding as these may become problematic during the course of treatment (if he develops bleeding, pain, thrombosed/infected) --continue to monitor   #Symptom Management --zofran and compazine for nausea management --precautions for  HFS on axitinib -- pain control with oxycodone 5mg  q4H PRN  #GOC --metastatic RCC carries a poor prognosis, discussed this with the patient and his mother --all treatments at this time are to  extend life and reduce tumor burden. There is no curative therapy   Orders Placed This Encounter  Procedures   CBC with Differential ([facility] Only)    Standing Status:   Future    Number of Occurrences:   1    Standing Expiration Date:   11/26/2020   CMP ([facility] only)    Standing Status:   Future    Number of Occurrences:   1    Standing Expiration Date:   11/26/2020   Lactate dehydrogenase (LDH)    Standing Status:   Future    Number of Occurrences:   1    Standing Expiration Date:   11/26/2020   Sedimentation rate    Standing Status:   Future    Number of Occurrences:   1    Standing Expiration Date:   11/26/2020   TSH    Standing Status:   Future    Number of Occurrences:   1    Standing Expiration Date:   11/26/2020    All questions were answered. The patient knows to call the clinic with any problems, questions or concerns.  A total of more than 60 minutes were spent on this encounter and over half of that time was spent on counseling and coordination of care as outlined above.   Ledell Peoples, MD Department of Hematology/Oncology [place] [facility] Phone: [phone] Pager: [phone] Email: [email or web address]  12/02/2019 1:20 PM   Literature Support:  Nancy Marus, [place] ER, Stus V, Clayborn Heron, Nosov D, [place] F, [place] B, Soulires D, [place] B, Bowman I, [place], [place] I, [place], Borchiellini D, [place], [place], McDermott RS, Farnam, Tartas S, Chang YH, Tamada S, Shou Q, Perini RF, [place], Atkins MB, [place] T; [employer] Investig[employer]. Pembrolizumab plus Axitinib versus Sunitinib for Advanced Renal-Cell Carcinoma. N Engl J Med. 2019 Mar 21;380(12):1116-1127  --Median  progression-free survival was 15.1 months in the pembrolizumab-axitinib group and 11.1 months in the sunitinib group (hazard ratio for disease progression or death, 0.69; 95% CI, 0.57 to 0.84; P<0.001). The objective response rate was 59.3% (95% CI, 54.5 to 63.9) in the pembrolizumab-axitinib group and 35.7% (95% CI, 31.1 to 40.4) in the sunitinib group (P<0.001). The benefit of pembrolizumab plus axitinib was observed across the International Metastatic Renal Cell [place]ittenberg risk groups (i.e., favorable, intermediate, and poor risk) and regardless of programmed death ligand 1 expression.

## 2019-11-27 NOTE — Telephone Encounter (Signed)
Patient is requesting a refill of the following medications: Requested Prescriptions   Pending Prescriptions Disp Refills  . oxyCODONE (OXY IR/ROXICODONE) 5 MG immediate release tablet 40 tablet 0    Sig: Take 1 tablet (5 mg total) by mouth 4 (four) times daily as needed.   Date of patient request: 11/27/2019 Last office visit: 11/25/2019 Date of last refill: 11/17/2019 Last refill amount: 40 tab

## 2019-11-28 ENCOUNTER — Other Ambulatory Visit: Payer: Self-pay | Admitting: Registered Nurse

## 2019-11-28 DIAGNOSIS — R1032 Left lower quadrant pain: Secondary | ICD-10-CM

## 2019-11-28 LAB — TSH: TSH: 1.105 u[IU]/mL (ref 0.320–4.118)

## 2019-11-28 NOTE — Telephone Encounter (Signed)
This medication looks like it was refilled on yesterday, I do not have clearance to decline. Please advise.

## 2019-11-28 NOTE — Telephone Encounter (Signed)
This was refilled yesterday. Looks like patient has picked it up. Patient is awaiting diagnostics and next steps from Onc - will delay giving any longer courses of treatment until further care planning is done  Thanks,  Kathrin Ruddy, nP

## 2019-12-02 ENCOUNTER — Telehealth: Payer: Self-pay

## 2019-12-02 ENCOUNTER — Telehealth: Payer: Self-pay | Admitting: Pharmacist

## 2019-12-02 ENCOUNTER — Encounter: Payer: Self-pay | Admitting: Hematology and Oncology

## 2019-12-02 DIAGNOSIS — C649 Malignant neoplasm of unspecified kidney, except renal pelvis: Secondary | ICD-10-CM

## 2019-12-02 MED ORDER — AXITINIB 5 MG PO TABS: 5.0000 mg | ORAL_TABLET | Freq: Two times a day (BID) | ORAL | 0 refills | Status: DC

## 2019-12-02 MED ORDER — PROCHLORPERAZINE MALEATE 10 MG PO TABS: 10.0000 mg | ORAL_TABLET | Freq: Four times a day (QID) | ORAL | 0 refills | Status: AC | PRN

## 2019-12-02 MED FILL — PROCHLORPERAZINE 10 MG TAB: 10 | 8 days supply | Qty: 30 | Fill #0

## 2019-12-02 NOTE — Telephone Encounter (Signed)
Oral Oncology Patient Advocate Encounter  Received notification from [employer] that prior authorization for Inlyta is required.  PA submitted on CoverMyMeds Key [id number] Status is pending  Oral Oncology Clinic will continue to follow.  [place] Patient [place] Phone [phone] Fax [phone] 12/02/2019 1:32 PM

## 2019-12-02 NOTE — Progress Notes (Signed)
START ON PATHWAY REGIMEN - Renal Cell     A cycle is every 21 days:     Axitinib      Pembrolizumab   **Always confirm dose/schedule in your pharmacy ordering system**  Patient Characteristics: Stage IV/Metastatic Disease, Clear Cell, First Line, Favorable Risk Therapeutic Status: Stage IV/Metastatic Disease Histology: Clear Cell Line of Therapy: First Line Risk Status: Favorable Risk Intent of Therapy: Non-Curative / Palliative Intent, Discussed with Patient

## 2019-12-02 NOTE — Telephone Encounter (Signed)
Oral Oncology Pharmacist Encounter  Received new prescription for Inlyta (axitinib) for the treatment of metastatic clear cell renal cell carcinoma  in conjunction with pembrolizumab, planned duration until disease progression or unacceptable drug toxicity.  Prescription dose and frequency assessed for appropriateness. Appropriate for therapy initiation.   CBC w/ Diff and CMP from 11/27/19 assessed, noted alk phos elevated at 3575 U/L; AST, ALT and T bili WNL - no dose adjustments required.  Current medication list in Epic reviewed, no significant/relevant DDIs with Inlyta identified.  Evaluated chart and no patient barriers to medication adherence noted.   Prescription has been e-scribed to the [facility] for benefits analysis and approval.  Oral Oncology Clinic will continue to follow for insurance authorization, copayment issues, initial counseling and start date.  Leron Croak, PharmD, BCPS Hematology/Oncology Clinical Pharmacist [facility] [phone] 12/02/2019 1:49 PM

## 2019-12-02 NOTE — Telephone Encounter (Signed)
Oral Oncology Patient Advocate Encounter  Prior Authorization for Bartholomew Boards has been approved.    PA# [id number] Effective dates: 12/02/19 through 05/30/20  Patient is required to use [place] [facility] will continue to follow.   [place] Patient [place] Phone [phone] Fax [phone] 12/02/2019 2:25 PM

## 2019-12-02 NOTE — Telephone Encounter (Signed)
Oral Oncology Pharmacist Encounter  Prescription for Inlyta (axitinib) is required to be filled through [facility] per [employer] requirements. Prescription redirected to their dispensing pharmacy.  Leron Croak, PharmD, BCPS Hematology/Oncology Clinical Pharmacist [facility] [phone] 12/02/2019 2:24 PM

## 2019-12-03 ENCOUNTER — Encounter: Payer: Self-pay | Admitting: Registered Nurse

## 2019-12-04 ENCOUNTER — Telehealth: Payer: Self-pay | Admitting: Hematology and Oncology

## 2019-12-04 ENCOUNTER — Other Ambulatory Visit: Payer: Self-pay | Admitting: Registered Nurse

## 2019-12-04 DIAGNOSIS — E559 Vitamin D deficiency, unspecified: Secondary | ICD-10-CM

## 2019-12-04 NOTE — Telephone Encounter (Signed)
Called pt per 8/31 sch msg-unable to reach pt . Left message with appt date and time

## 2019-12-05 ENCOUNTER — Telehealth: Payer: Self-pay | Admitting: *Deleted

## 2019-12-05 NOTE — Progress Notes (Signed)
Pharmacist Chemotherapy Monitoring - Initial Assessment    Anticipated start date: 12/14/19   Regimen:  . Are orders appropriate based on the patient's diagnosis, regimen, and cycle? Yes . Does the plan date match the patient's scheduled date? Yes . Is the sequencing of drugs appropriate? Yes . Are the premedications appropriate for the patient's regimen? Yes . Prior Authorization for treatment is: Pending o If applicable, is the correct biosimilar selected based on the patient's insurance? not applicable  Organ Function and Labs: Marland Kitchen Are dose adjustments needed based on the patient's renal function, hepatic function, or hematologic function? No . Are appropriate labs ordered prior to the start of patient's treatment? Yes . Other organ system assessment, if indicated: N/A . The following baseline labs, if indicated, have been ordered: pembrolizumab: baseline TSH +/- T4  Dose Assessment: . Are the drug doses appropriate? Yes . Are the following correct: o Drug concentrations Yes o IV fluid compatible with drug Yes o Administration routes Yes o Timing of therapy Yes . If applicable, does the patient have documented access for treatment and/or plans for port-a-cath placement? not applicable . If applicable, have lifetime cumulative doses been properly documented and assessed? not applicable Lifetime Dose Tracking  No doses have been documented on this patient for the following tracked chemicals: Doxorubicin, Epirubicin, Idarubicin, Daunorubicin, Mitoxantrone, Bleomycin, Oxaliplatin, Carboplatin, Liposomal Doxorubicin  o   Toxicity Monitoring/Prevention: . The patient has the following take home antiemetics prescribed: N/A . The patient has the following take home medications prescribed: N/A . Medication allergies and previous infusion related reactions, if applicable, have been reviewed and addressed. Yes . The patient's current medication list has been assessed for drug-drug  interactions with their chemotherapy regimen. no significant drug-drug interactions were identified on review.  Order Review: . Are the treatment plan orders signed? Yes . Is the patient scheduled to see a provider prior to their treatment? Yes  I verify that I have reviewed each item in the above checklist and answered each question accordingly.  Chevelle Durr K 12/05/2019 3:43 PM

## 2019-12-05 NOTE — Telephone Encounter (Signed)
Received call from pt's mother, [name]. She states [name] is still having problem with prolapsed hemorrhoids, that actually they are worse. She also states that his abdomen has increased in size and overall he is very uncomfortable. She is aware that a referral was made to [facility] surgery. Provided phone # to her as she states she would like to call and get him an appt.  Advised that if patient gets worse that he should go to the ED but that likely he will have a long wait there. Informed her of our Symptom management clinic and that we could see him there on Tuesday if that would help. She states she will talk to [place] about that and call if he wants to come to that clinic next week.  Dr. Lorenso Courier made aware.

## 2019-12-08 ENCOUNTER — Encounter: Payer: Self-pay | Admitting: Registered Nurse

## 2019-12-09 ENCOUNTER — Ambulatory Visit (HOSPITAL_COMMUNITY)
Admission: RE | Admit: 2019-12-09 | Discharge: 2019-12-09 | Disposition: A | Discharge status: Home / Self Care | Payer: 59 | Source: Ambulatory Visit | Attending: Hematology and Oncology | Admitting: Hematology and Oncology

## 2019-12-09 ENCOUNTER — Other Ambulatory Visit: Payer: Self-pay | Admitting: Registered Nurse

## 2019-12-09 ENCOUNTER — Other Ambulatory Visit: Payer: Self-pay

## 2019-12-09 ENCOUNTER — Inpatient Hospital Stay: Payer: 59 | Attending: Hematology and Oncology

## 2019-12-09 DIAGNOSIS — C786 Secondary malignant neoplasm of retroperitoneum and peritoneum: Secondary | ICD-10-CM | POA: Insufficient documentation

## 2019-12-09 DIAGNOSIS — C801 Malignant (primary) neoplasm, unspecified: Secondary | ICD-10-CM | POA: Insufficient documentation

## 2019-12-09 DIAGNOSIS — C649 Malignant neoplasm of unspecified kidney, except renal pelvis: Secondary | ICD-10-CM | POA: Diagnosis present

## 2019-12-09 DIAGNOSIS — F17210 Nicotine dependence, cigarettes, uncomplicated: Secondary | ICD-10-CM | POA: Insufficient documentation

## 2019-12-09 DIAGNOSIS — C7800 Secondary malignant neoplasm of unspecified lung: Secondary | ICD-10-CM | POA: Insufficient documentation

## 2019-12-09 DIAGNOSIS — Z79899 Other long term (current) drug therapy: Secondary | ICD-10-CM | POA: Insufficient documentation

## 2019-12-09 DIAGNOSIS — C7931 Secondary malignant neoplasm of brain: Secondary | ICD-10-CM | POA: Insufficient documentation

## 2019-12-09 DIAGNOSIS — R1032 Left lower quadrant pain: Secondary | ICD-10-CM

## 2019-12-09 DIAGNOSIS — Z515 Encounter for palliative care: Secondary | ICD-10-CM

## 2019-12-09 DIAGNOSIS — Z5112 Encounter for antineoplastic immunotherapy: Secondary | ICD-10-CM | POA: Insufficient documentation

## 2019-12-09 MED ORDER — GADOBUTROL 1 MMOL/ML IV SOLN
7.5000 mL | Freq: Once | INTRAVENOUS | Status: AC | PRN
  Administered 2019-12-09: 7.5 mL via INTRAVENOUS

## 2019-12-09 MED ORDER — OXYCODONE HCL 15 MG PO TABS: 15.0000 mg | ORAL_TABLET | ORAL | 0 refills | Status: DC | PRN

## 2019-12-09 NOTE — Telephone Encounter (Signed)
Oral Chemotherapy Pharmacist Encounter  I spoke with patient for overview of: Inlyta (axitinib) for the treatment of metastatic clear cell renal cell carcinoma, in conjunction with pembrolizumab, planned duration until disease progression or unacceptable toxicity.   Counseled patient on administration, dosing, side effects, monitoring, drug-food interactions, safe handling, storage, and disposal.  Patient will take Inlyta 5mg  tablets, 1 tablet by mouth twice daily, without regard to food, with a glass of water.  Patient instructed to avoid grapefruit and grapefruit juice while on therapy with Inlyta.  Inlyta start date: 12/12/19  Adverse effects include but are not limited to: hypertension, hand-foot syndrome, nausea, vomiting, diarrhea, fatigue, dysphonia (hoarseness), and abnormal laboratory values.   Patient will obtain anti diarrheal and alert the office of 4 or more loose stools above baseline.  Inlyta will be held at least 24 hours prior to surgery and resumed at discretion of treating physician based on wound healing  Reviewed with patient importance of keeping a medication schedule and plan for any missed doses. No barriers to medication adherence identified.  Medication reconciliation performed and medication/allergy list updated.  Medication education handout placed in mail for patient.   Patient's insurance requires to fill Inlyta through [facility]. Phone number: [phone]  All questions answered.  Mr. Kanady voiced understanding and appreciation.   Patient knows to call the office with questions or concerns.  Leron Croak, PharmD, BCPS Hematology/Oncology Clinical Pharmacist [facility] [phone] 12/09/2019 9:24 AM

## 2019-12-09 NOTE — Telephone Encounter (Signed)
Called and spoke to patient about the medication change and the referral that was sent.

## 2019-12-09 NOTE — Telephone Encounter (Signed)
Pt states Oxycodone not lasting long enough should he get this from you or Oncology?  States they have no more last refill 11/27/2019 for 40 tablets

## 2019-12-10 ENCOUNTER — Ambulatory Visit
Admission: RE | Admit: 2019-12-10 | Discharge: 2019-12-10 | Disposition: A | Discharge status: Home / Self Care | Payer: 59 | Source: Ambulatory Visit | Attending: Registered Nurse | Admitting: Registered Nurse

## 2019-12-10 DIAGNOSIS — R059 Cough, unspecified: Secondary | ICD-10-CM

## 2019-12-11 ENCOUNTER — Telehealth: Payer: Self-pay | Admitting: Registered Nurse

## 2019-12-11 ENCOUNTER — Other Ambulatory Visit: Payer: Self-pay | Admitting: Hematology and Oncology

## 2019-12-11 ENCOUNTER — Encounter: Payer: Self-pay | Admitting: Hematology and Oncology

## 2019-12-11 DIAGNOSIS — C649 Malignant neoplasm of unspecified kidney, except renal pelvis: Secondary | ICD-10-CM

## 2019-12-11 NOTE — Progress Notes (Signed)
Called pt to introduce myself as his Arboriculturist.  Unfortunately there aren't any foundations offering copay assistance for his Dx and the type of ins he has.  I informed him of the [employer], went over what it covers and gave him the income requirement.  Pt would like to apply so he will bring proof of income on 12/12/19.  If approved I will give him an expense sheet and my card for any questions or concerns he may have in the future.

## 2019-12-11 NOTE — Telephone Encounter (Signed)
[facility] called and stated for provider to look at pts chart from the notes of pts CT Chest with Contrast results that were done yesterday. Please advise.

## 2019-12-12 ENCOUNTER — Other Ambulatory Visit: Payer: 59

## 2019-12-12 ENCOUNTER — Inpatient Hospital Stay (HOSPITAL_BASED_OUTPATIENT_CLINIC_OR_DEPARTMENT_OTHER): Payer: 59 | Admitting: Hematology and Oncology

## 2019-12-12 ENCOUNTER — Other Ambulatory Visit: Payer: Self-pay

## 2019-12-12 ENCOUNTER — Other Ambulatory Visit: Payer: Self-pay | Admitting: Radiation Therapy

## 2019-12-12 ENCOUNTER — Ambulatory Visit: Payer: 59

## 2019-12-12 ENCOUNTER — Inpatient Hospital Stay: Payer: 59

## 2019-12-12 ENCOUNTER — Other Ambulatory Visit: Payer: Self-pay | Admitting: Hematology and Oncology

## 2019-12-12 ENCOUNTER — Telehealth: Payer: Self-pay | Admitting: *Deleted

## 2019-12-12 ENCOUNTER — Ambulatory Visit: Payer: 59 | Admitting: Hematology and Oncology

## 2019-12-12 VITALS — BP 110/79 | HR 99 | Temp 97.8°F | Resp 20

## 2019-12-12 DIAGNOSIS — L0292 Furuncle, unspecified: Secondary | ICD-10-CM | POA: Diagnosis not present

## 2019-12-12 DIAGNOSIS — C649 Malignant neoplasm of unspecified kidney, except renal pelvis: Secondary | ICD-10-CM

## 2019-12-12 DIAGNOSIS — Z5111 Encounter for antineoplastic chemotherapy: Secondary | ICD-10-CM | POA: Diagnosis not present

## 2019-12-12 DIAGNOSIS — C801 Malignant (primary) neoplasm, unspecified: Secondary | ICD-10-CM | POA: Diagnosis present

## 2019-12-12 DIAGNOSIS — Z5112 Encounter for antineoplastic immunotherapy: Secondary | ICD-10-CM | POA: Diagnosis not present

## 2019-12-12 DIAGNOSIS — F17210 Nicotine dependence, cigarettes, uncomplicated: Secondary | ICD-10-CM | POA: Diagnosis not present

## 2019-12-12 DIAGNOSIS — C7800 Secondary malignant neoplasm of unspecified lung: Secondary | ICD-10-CM | POA: Diagnosis not present

## 2019-12-12 DIAGNOSIS — Z79899 Other long term (current) drug therapy: Secondary | ICD-10-CM | POA: Diagnosis not present

## 2019-12-12 DIAGNOSIS — C786 Secondary malignant neoplasm of retroperitoneum and peritoneum: Secondary | ICD-10-CM | POA: Diagnosis not present

## 2019-12-12 DIAGNOSIS — C7931 Secondary malignant neoplasm of brain: Secondary | ICD-10-CM | POA: Diagnosis present

## 2019-12-12 DIAGNOSIS — C7949 Secondary malignant neoplasm of other parts of nervous system: Secondary | ICD-10-CM

## 2019-12-12 LAB — CMP (CANCER CENTER ONLY)
ALT: 11 U/L (ref 0–44)
AST: 37 U/L (ref 15–41)
Albumin: 2.7 g/dL — ABNORMAL LOW (ref 3.5–5.0)
Alkaline Phosphatase: 4060 U/L — ABNORMAL HIGH (ref 38–126)
Anion gap: 11 (ref 5–15)
BUN: 9 mg/dL (ref 6–20)
CO2: 24 mmol/L (ref 22–32)
Calcium: 8.8 mg/dL — ABNORMAL LOW (ref 8.9–10.3)
Chloride: 94 mmol/L — ABNORMAL LOW (ref 98–111)
Creatinine: 0.73 mg/dL (ref 0.61–1.24)
GFR, Est AFR Am: >60 mL/min (ref >60)
GFR, Estimated: >60 mL/min (ref >60)
Glucose, Bld: 84 mg/dL (ref 70–99)
Potassium: 4.6 mmol/L (ref 3.5–5.1)
Sodium: 129 mmol/L — ABNORMAL LOW (ref 135–145)
Total Bilirubin: 0.5 mg/dL (ref 0.3–1.2)
Total Protein: 6.3 g/dL — ABNORMAL LOW (ref 6.5–8.1)

## 2019-12-12 LAB — CBC WITH DIFFERENTIAL (CANCER CENTER ONLY)
Abs Immature Granulocytes: 0.03 10*3/uL (ref 0.00–0.07)
Basophils Absolute: 0 10*3/uL (ref 0.0–0.1)
Basophils Relative: 0 %
Eosinophils Absolute: 0 10*3/uL (ref 0.0–0.5)
Eosinophils Relative: 1 %
HCT: 45.4 % (ref 39.0–52.0)
Hemoglobin: 16 g/dL (ref 13.0–17.0)
Immature Granulocytes: 0 %
Lymphocytes Relative: 12 %
Lymphs Abs: 0.9 10*3/uL (ref 0.7–4.0)
MCH: 33.3 pg (ref 26.0–34.0)
MCHC: 35.2 g/dL (ref 30.0–36.0)
MCV: 94.4 fL (ref 80.0–100.0)
Monocytes Absolute: 0.5 10*3/uL (ref 0.1–1.0)
Monocytes Relative: 7 %
Neutro Abs: 6.4 10*3/uL (ref 1.7–7.7)
Neutrophils Relative %: 80 %
Platelet Count: 159 10*3/uL (ref 150–400)
RBC: 4.81 MIL/uL (ref 4.22–5.81)
RDW: 13.8 % (ref 11.5–15.5)
WBC Count: 7.9 10*3/uL (ref 4.0–10.5)
nRBC: 0 % (ref 0.0–0.2)

## 2019-12-12 LAB — LACTATE DEHYDROGENASE: LDH: 303 U/L — ABNORMAL HIGH (ref 98–192)

## 2019-12-12 LAB — PHOSPHORUS: Phosphorus: 3.4 mg/dL (ref 2.5–4.6)

## 2019-12-12 LAB — URIC ACID: Uric Acid, Serum: 8.7 mg/dL — ABNORMAL HIGH (ref 3.7–8.6)

## 2019-12-12 LAB — TSH: TSH: 2.301 u[IU]/mL (ref 0.320–4.118)

## 2019-12-12 MED ORDER — ALLOPURINOL 300 MG PO TABS: 300.0000 mg | ORAL_TABLET | Freq: Every day | ORAL | 1 refills | Status: AC

## 2019-12-12 MED ORDER — SODIUM CHLORIDE 0.9 % IV SOLN
200.0000 mg | Freq: Once | INTRAVENOUS | Status: AC
  Administered 2019-12-12: 200 mg via INTRAVENOUS
  Filled 2019-12-12: qty 8

## 2019-12-12 MED ORDER — SODIUM CHLORIDE 0.9 % IV SOLN
Freq: Once | INTRAVENOUS | Status: AC
Start: 2019-12-12 — End: 2019-12-12
  Filled 2019-12-12: qty 250

## 2019-12-12 NOTE — Patient Instructions (Signed)
[facility] Discharge Instructions for Patients Receiving Chemotherapy  Today you received the following chemotherapy agents: pembrolizumab.  To help prevent nausea and vomiting after your treatment, we encourage you to take your nausea medication as directed.   If you develop nausea and vomiting that is not controlled by your nausea medication, call the clinic.   BELOW ARE SYMPTOMS THAT SHOULD BE REPORTED IMMEDIATELY:  *FEVER GREATER THAN 100.5 F  *CHILLS WITH OR WITHOUT FEVER  NAUSEA AND VOMITING THAT IS NOT CONTROLLED WITH YOUR NAUSEA MEDICATION  *UNUSUAL SHORTNESS OF BREATH  *UNUSUAL BRUISING OR BLEEDING  TENDERNESS IN MOUTH AND THROAT WITH OR WITHOUT PRESENCE OF ULCERS  *URINARY PROBLEMS  *BOWEL PROBLEMS  UNUSUAL RASH Items with * indicate a potential emergency and should be followed up as soon as possible.  Feel free to call the clinic should you have any questions or concerns. The clinic phone number is [phone].  Please show the CHEMO ALERT CARD at check-in to the Emergency Department and triage nurse.  Pembrolizumab injection What is this medicine? PEMBROLIZUMAB (pem broe liz ue mab) is a monoclonal antibody. It is used to treat certain types of cancer. This medicine may be used for other purposes; ask your health care provider or pharmacist if you have questions. COMMON BRAND NAME(S): Keytruda What should I tell my health care provider before I take this medicine? They need to know if you have any of these conditions:  diabetes  immune system problems  inflammatory bowel disease  liver disease  lung or breathing disease  lupus  received or scheduled to receive an organ transplant or a stem-cell transplant that uses donor stem cells  an unusual or allergic reaction to pembrolizumab, other medicines, foods, dyes, or preservatives  pregnant or trying to get pregnant  breast-feeding How should I use this medicine? This medicine  is for infusion into a vein. It is given by a health care professional in a hospital or clinic setting. A special MedGuide will be given to you before each treatment. Be sure to read this information carefully each time. Talk to your pediatrician regarding the use of this medicine in children. While this drug may be prescribed for children as young as 6 months for selected conditions, precautions do apply. Overdosage: If you think you have taken too much of this medicine contact a poison control center or emergency room at once. NOTE: This medicine is only for you. Do not share this medicine with others. What if I miss a dose? It is important not to miss your dose. Call your doctor or health care professional if you are unable to keep an appointment. What may interact with this medicine? Interactions have not been studied. Give your health care provider a list of all the medicines, herbs, non-prescription drugs, or dietary supplements you use. Also tell them if you smoke, drink alcohol, or use illegal drugs. Some items may interact with your medicine. This list may not describe all possible interactions. Give your health care provider a list of all the medicines, herbs, non-prescription drugs, or dietary supplements you use. Also tell them if you smoke, drink alcohol, or use illegal drugs. Some items may interact with your medicine. What should I watch for while using this medicine? Your condition will be monitored carefully while you are receiving this medicine. You may need blood work done while you are taking this medicine. Do not become pregnant while taking this medicine or for 4 months after stopping it. Women should   inform their doctor if they wish to become pregnant or think they might be pregnant. There is a potential for serious side effects to an unborn child. Talk to your health care professional or pharmacist for more information. Do not breast-feed an infant while taking this medicine or  for 4 months after the last dose. What side effects may I notice from receiving this medicine? Side effects that you should report to your doctor or health care professional as soon as possible:  allergic reactions like skin rash, itching or hives, swelling of the face, lips, or tongue  bloody or black, tarry  breathing problems  changes in vision  chest pain  chills  confusion  constipation  cough  diarrhea  dizziness or feeling faint or lightheaded  fast or irregular heartbeat  fever  flushing  joint pain  low blood counts - this medicine may decrease the number of white blood cells, red blood cells and platelets. You may be at increased risk for infections and bleeding.  muscle pain  muscle weakness  pain, tingling, numbness in the hands or feet  persistent headache  redness, blistering, peeling or loosening of the skin, including inside the mouth  signs and symptoms of high blood sugar such as dizziness; dry mouth; dry skin; fruity breath; nausea; stomach pain; increased hunger or thirst; increased urination  signs and symptoms of kidney injury like trouble passing urine or change in the amount of urine  signs and symptoms of liver injury like dark urine, light-colored stools, loss of appetite, nausea, right upper belly pain, yellowing of the eyes or skin  sweating  swollen lymph nodes  weight loss Side effects that usually do not require medical attention (report to your doctor or health care professional if they continue or are bothersome):  decreased appetite  hair loss  muscle pain  tiredness This list may not describe all possible side effects. Call your doctor for medical advice about side effects. You may report side effects to FDA at 1-800-FDA-1088. Where should I keep my medicine? This drug is given in a hospital or clinic and will not be stored at home. NOTE: This sheet is a summary. It may not cover all possible information. If you  have questions about this medicine, talk to your doctor, pharmacist, or health care provider.  2020 Elsevier/Gold Standard (2019-01-24 18:07:58)   

## 2019-12-12 NOTE — Telephone Encounter (Signed)
TCT patient regarding appts with [facility]. Spoke with him. He is driving home from his chemotherapy . He was able to give the phone to his mother and she was able to write down the dates and times for these appts:  12/15/19 2:45 pm with Vaughan Browner, PA for the abscess on his back. 12/24/19 @ 9:45 am with Dr. Windle Guard for his prolapsed hemorrhoids.  She voiced understanding and was able to write these down

## 2019-12-13 ENCOUNTER — Encounter: Payer: Self-pay | Admitting: Hematology and Oncology

## 2019-12-13 NOTE — Progress Notes (Signed)
[place] [phone]   [phone]  PROGRESS NOTE  Patient Care Team: Maximiano Coss, NP as PCP - General (Adult Health Nurse Practitioner)  Hematological/Oncological History # Metastatic Clear Cell Carcinoma, Likely of Kidney Origin 1) 08/08/2019: presented to PCP with abdominal cramping.  2) 11/07/2019: CT abdomen pelvis performed which revealed extensive omental caking encasing nearly all of the viscera in the abdomen and pelvis. It demonstrates mass effect on the liver and spleen without any evidence of bowel obstruction 3) 11/19/2019: biopsy performed of omental mass, findings shows carcinoma with clear cell features consistent with primary renal cell carcinoma 4) 11/27/2019: establish care with Dr. Lorenso Courier  5) 12/12/2019: Cycle 1 Day 1 of Pembrolizumab/Axitinib  Interval History:  Aaron Davidson 49 y.o. male with medical history significant for metastatic clear cell carcinoma, likely of kidney origin who presents for a follow up visit. The patient's last visit was on 11/27/2019 at which time he established care. In the interim since the last visit he has undergone imaging showing metastatic disease in the brain and chest. Today is Cycle 1 Day 1 of Pembrolizumab/Axitinib.   On exam today Aaron Davidson continues to be in pain.  He reports that he has been taking his oxycodone 15 mg every 4 hours as the 5 mg pill simply was not helping to control his pain.  He reports that he has received his axitinib medication and is ready to start it when given the cue.  He also wanted to discuss the results of his MRI and CT scan today.  He otherwise denies having any problems with fevers, chills, sweats, nausea, vomiting or diarrhea.  Prior to the start of chemotherapy was noted that he had a lesion on his back.  On further observation it appeared to be a ping-pong ball sized fluid-filled lesion consistent with a furuncle or boil.  He notes that he is not having any pain  as result of this and that it has not drained.  A full 10 point ROS is listed below.  MEDICAL HISTORY:  No past medical history on file.  SURGICAL HISTORY: Past Surgical History:  Procedure Laterality Date   DENTAL SURGERY     HERNIA REPAIR N/A    [employer] 09/09/2019   INGUINAL HERNIA REPAIR Left 12/13/2015   Procedure: LAPAROSCOPIC LEFT  INGUINAL HERNIA;  Surgeon: Ralene Ok, MD;  Location: [facility];  Service: General;  Laterality: Left;   INSERTION OF MESH Left 12/13/2015   Procedure: INSERTION OF MESH;  Surgeon: Ralene Ok, MD;  Location: [facility];  Service: General;  Laterality: Left;   IR US GUIDE BX ASP/DRAIN  11/19/2019   KNEE SURGERY Right    PILONIDAL CYST EXCISION      SOCIAL HISTORY: Social History   Socioeconomic History   Marital status: Divorced    Spouse name: Not on file   Number of children: Not on file   Years of education: Not on file   Highest education level: Not on file  Occupational History   Not on file  Tobacco Use   Smoking status: Current Some Day Smoker    Packs/day: 2.00    Years: 38.00    Pack years: 76.00    Types: Cigarettes   Smokeless tobacco: Never Used  Substance and Sexual Activity   Alcohol use: Yes    Alcohol/week: 1.0 standard drink    Types: 1 Standard drinks or equivalent per week    Comment: rare   Drug use: No   Sexual  activity: Not on file  Other Topics Concern   Not on file  Social History Narrative   Not on file   Social Determinants of Health   Financial Resource Strain:    Difficulty of Paying Living Expenses: Not on file  Food Insecurity:    Worried About [place] in the Last Year: Not on file   Ran Out of Food in the Last Year: Not on file  Transportation Needs:    Lack of Transportation (Medical): Not on file   Lack of Transportation (Non-Medical): Not on file  Physical Activity:    Days of Exercise per Week: Not on file   Minutes of Exercise per Session: Not on file   Stress:    Feeling of Stress : Not on file  Social Connections:    Frequency of Communication with Friends and Family: Not on file   Frequency of Social Gatherings with Friends and Family: Not on file   Attends Religious Services: Not on file   Active Member of Clubs or Organizations: Not on file   Attends Archivist Meetings: Not on file   Marital Status: Not on file  Intimate Partner Violence:    Fear of Current or Ex-Partner: Not on file   Emotionally Abused: Not on file   Physically Abused: Not on file   Sexually Abused: Not on file    FAMILY HISTORY: Family History  Problem Relation Age of Onset   Heart disease Father     ALLERGIES:  is allergic to no known allergies.  MEDICATIONS:  Current Outpatient Medications  Medication Sig Dispense Refill   allopurinol (ZYLOPRIM) 300 MG tablet Take 1 tablet (300 mg total) by mouth daily. 30 tablet 1   ALPRAZolam (XANAX) 0.25 MG tablet TAKE 1 TABLET(0.25 MG) BY MOUTH AT BEDTIME AS NEEDED FOR ANXIETY. (Patient taking differently: Take 0.25 mg by mouth at bedtime. ) 30 tablet 0   dicyclomine (BENTYL) 10 MG capsule TAKE 1 CAPSULE(10 MG) BY MOUTH FOUR TIMES DAILY BEFORE MEALS AND AT BEDTIME 45 capsule 0   diltiazem 2 % GEL Apply 1 application topically 4 (four) times daily.     docusate sodium (COLACE) 100 MG capsule Take 100 mg by mouth daily as needed for mild constipation.     gabapentin (NEURONTIN) 600 MG tablet Take 1 tablet (600 mg total) by mouth 2 (two) times daily. 90 tablet 0   INLYTA 5 MG tablet Take 1 tablet by mouth every 12 hours 60 tablet 0   Lidocaine-Hydrocort, Perianal, 3-0.5 % CREA Place 1 application rectally 2 (two) times daily. (Patient taking differently: Place 1 application rectally daily as needed (Hemorrhoids). ) 28.3 g 5   ondansetron (ZOFRAN-ODT) 4 MG disintegrating tablet Take 1 tablet (4 mg total) by mouth 4 (four) times daily as needed. 40 tablet 3   ondansetron (ZOFRAN-ODT) 8  MG disintegrating tablet Take 1 tablet (8 mg total) by mouth every 8 (eight) hours as needed for nausea or vomiting. 20 tablet 0   oxyCODONE (ROXICODONE) 15 MG immediate release tablet Take 1 tablet (15 mg total) by mouth every 4 (four) hours as needed for pain. 60 tablet 0   prochlorperazine (COMPAZINE) 10 MG tablet Take 1 tablet (10 mg total) by mouth every 6 (six) hours as needed for nausea or vomiting. 30 tablet 0   sildenafil (VIAGRA) 25 MG tablet Take 2 tablets (50 mg total) by mouth daily as needed for erectile dysfunction. (Patient taking differently: Take 25 mg by mouth daily  as needed for erectile dysfunction. ) 30 tablet 3   No current facility-administered medications for this visit.    REVIEW OF SYSTEMS:   Constitutional: ( - ) fevers, ( - )  chills , ( - ) night sweats Eyes: ( - ) blurriness of vision, ( - ) double vision, ( - ) watery eyes Ears, nose, mouth, throat, and face: ( - ) mucositis, ( - ) sore throat Respiratory: ( - ) cough, ( - ) dyspnea, ( - ) wheezes Cardiovascular: ( - ) palpitation, ( - ) chest discomfort, ( - ) lower extremity swelling Gastrointestinal:  ( - ) nausea, ( - ) heartburn, ( - ) change in bowel habits Skin: ( - ) abnormal skin rashes Lymphatics: ( - ) new lymphadenopathy, ( - ) easy bruising Neurological: ( - ) numbness, ( - ) tingling, ( - ) new weaknesses Behavioral/Psych: ( - ) mood change, ( - ) new changes  All other systems were reviewed with the patient and are negative.  PHYSICAL EXAMINATION: ECOG PERFORMANCE STATUS: 2 - Symptomatic, <50% confined to bed  There were no vitals filed for this visit. There were no vitals filed for this visit.  GENERAL: middle age 49 male. alert, no distress and comfortable SKIN: skin color, texture, turgor are normal, no rashes or significant lesions. Large boil like lesion on back without tenderness or drainage. Feels fluid filled.  EYES: conjunctiva are pink and non-injected, sclera  clear LUNGS: clear to auscultation and percussion with normal breathing effort HEART: regular rate & rhythm and no murmurs and no lower extremity edema ABDOMEN: firm and tender with no evidence of fluid.  Musculoskeletal: no cyanosis of digits and no clubbing  PSYCH: alert & oriented x 3, fluent speech NEURO: no focal motor/sensory deficits  LABORATORY DATA:  I have reviewed the data as listed CBC Latest Ref Rng & Units 12/12/2019 11/27/2019 11/19/2019  WBC 4.0 - 10.5 K/uL 7.9 8.7 9.5  Hemoglobin 13.0 - 17.0 g/dL 16.0 17.7(H) 17.0  Hematocrit 39 - 52 % 45.4 51.3 50.8  Platelets 150 - 400 K/uL 159 182 203    CMP Latest Ref Rng & Units 12/12/2019 11/27/2019 08/08/2019  Glucose 70 - 99 mg/dL 84 94 92  BUN 6 - 20 mg/dL 9 11 4(L)  Creatinine 0.61 - 1.24 mg/dL 0.73 0.98 0.88  Sodium 135 - 145 mmol/L 129(L) 133(L) 139  Potassium 3.5 - 5.1 mmol/L 4.6 5.1 4.9  Chloride 98 - 111 mmol/L 94(L) 97(L) 101  CO2 22 - 32 mmol/L 24 27 27   Calcium 8.9 - 10.3 mg/dL 8.8(L) 9.7 9.2  Total Protein 6.5 - 8.1 g/dL 6.3(L) 6.7 6.8  Total Bilirubin 0.3 - 1.2 mg/dL 0.5 0.4 0.4  Alkaline Phos 38 - 126 U/L 4,060(H) 3,575(H) 225(H)  AST 15 - 41 U/L 37 33 49(H)  ALT 0 - 44 U/L 11 9 41     RADIOGRAPHIC STUDIES: I have personally reviewed the radiological images as listed and agreed with the findings in the report: metastatic disease of the lung with lesions noted in the brain on MRI.   CT Chest Wo Contrast  Result Date: 12/11/2019 CLINICAL DATA:  Dyspnea, chronic of unclear etiology. EXAM: CT CHEST WITHOUT CONTRAST TECHNIQUE: Multidetector CT imaging of the chest was performed following the standard protocol without IV contrast. COMPARISON:  CT abdomen and pelvis November 07, 2019 FINDINGS: Cardiovascular: Heart size is normal without pericardial effusion. Minimal calcified atheromatous plaque in the thoracic aorta. No aneurysmal dilation. Central pulmonary  vasculature of normal caliber. Limited assessment of heart and  cardiovascular structures in general without intravenous contrast. Mediastinum/Nodes: Thoracic inlet structures are normal. No axillary lymphadenopathy. Bulky AP window and LEFT paratracheal adenopathy as well as LEFT hilar adenopathy. As a conglomerate of lymph nodes in the LEFT paratracheal and AP window regions a 4.4 x 2.8 cm nodal mass is noted. LEFT hilar and suprahilar nodal disease (image 67, series 8) 3.5 x 3.3 cm. Pre-vascular lymph nodes (image 60, series 2) 1.5 cm short axis lymph node. Enlarged lymph nodes track up through the anterior mediastinum with the retrosternal pre-vascular node measuring 1 cm on image 48 of series 2. Scattered small lymph nodes in the RIGHT mediastinum. No gross RIGHT hilar adenopathy. Esophagus grossly normal. Lungs/Pleura: Signs of extensive subpleural reticulation and septal thickening on a background of both paraseptal and centrilobular emphysema. Basilar atelectasis on the RIGHT overlying the RIGHT hemidiaphragm which is elevated in the setting of diffuse abdominal tumor. Lobular LEFT upper lobe pulmonary mass measures 4 x 2.1 cm (image 45, series 8) this is found along the pleural surface in the LEFT upper lobe antral laterally with associated pleural thickening. There is also a discrete nodule seen inferiorly along the pleura (image 66, series 8) 9 mm. Subtle pleural nodularity along the major fissure in the LEFT chest. No pericardial effusion. No pleural effusion. Airways are patent. Wile patent LEFT upper lobe airways are narrowed at the LEFT hilum due to LEFT hilar mass. Lung bases without signs of nodules or masses. Upper Abdomen: Incidental imaging of upper abdominal contents again shows diffuse peritoneal involvement and hepatic parenchymal involvement. Areas not well evaluated given lack of intravenous contrast, grossly unchanged compared to August 6th with extensive subdiaphragmatic, hepatic and peritoneal disease. Musculoskeletal: No acute musculoskeletal  process. No destructive bone finding. There is mixed lucent and sclerotic change in the RIGHT glenoid in the absence of additional signs of degenerative process. This tracks into the body of the RIGHT scapula measuring approximately 2.8 x 2.5 cm. IMPRESSION: 1. LEFT upper lobe pulmonary mass with associated hilar and mediastinal adenopathy. Findings would be more indicative of a primary pulmonary neoplasm given the location in the lack of any basilar nodules that would better fit the characteristics of metastasis. Biopsy may be helpful to determine whether this is the source of clear cell tumor in the abdomen or represents a second primary. 2. Diffuse interstitial thickening and subtle nodularity of pleural surfaces with a discrete pleural nodule seen distant from the dominant nodule in the LEFT upper lobe. Constellation of findings raising the question of lymphangitic carcinomatosis and additional sites of upper lobe involvement. Background interstitial lung disease is also possible. 3. Abdominal findings better displayed on recent abdominal and pelvic CT. Of note there is no discrete renal mass seen on the prior CT. Would consider other potential sites for clear cell neoplasm including the lung as differential possibilities that would explain recent biopsy results. Hepatic tumors may also display clear cell characteristics. 4. Mixed lucent and sclerotic process in the RIGHT glenoid in the absence of significant degenerative change raising the question of metastatic lesion to this area in the RIGHT scapula. MRI may be helpful as warranted for further evaluation. 5. Emphysema and aortic atherosclerosis. These results will be called to the ordering clinician or representative by the Radiologist Assistant, and communication documented in the PACS or [employer]. Aortic Atherosclerosis (ICD10-I70.0) and Emphysema (ICD10-J43.9). Electronically Signed   By: Zetta Bills M.D.   On: 12/11/2019 08:28  MR Brain W  Wo Contrast  Result Date: 12/10/2019 CLINICAL DATA:  Initial evaluation for urologic cancer, staging. EXAM: MRI HEAD WITHOUT AND WITH CONTRAST TECHNIQUE: Multiplanar, multiecho pulse sequences of the brain and surrounding structures were obtained without and with intravenous contrast. CONTRAST:  7.1mL GADAVIST GADOBUTROL 1 MMOL/ML IV SOLN COMPARISON:  None available. FINDINGS: Brain: Cerebral volume within normal limits for age. Mild scattered T2/FLAIR hyperintensity noted within the periventricular and deep white matter both cerebral hemispheres, nonspecific, but most like related chronic microvascular ischemic disease, mild for age. No evidence for acute infarct. Gray-white matter differentiation maintained. No encephalomalacia to suggest chronic cortical infarction. No evidence for acute or chronic intracranial hemorrhage. 11 mm focus of enhancement seen involving the cortex of the parasagittal right parietal lobe (series 15, image 102). Additional subtle 6 mm focus of patchy enhancement seen involving the right cerebellar vermis (series 15, image 50). Findings suspicious for possible metastatic disease. No associated mass effect or hemorrhage. No other mass lesion, mass effect, or midline shift. No hydrocephalus or extra-axial fluid collection. Pituitary gland suprasellar region within normal limits. Midline structures intact. Vascular: Major intracranial vascular flow voids are maintained. Skull and upper cervical spine: Craniocervical junction within normal limits. Bone marrow signal intensity normal. 9 mm soft tissue nodule present at the high right parietal scalp (series 11, image 44), indeterminate. Sinuses/Orbits: Globes and orbital soft tissues demonstrate no acute finding. Paranasal sinuses are clear. No significant mastoid effusion. Inner ear structures grossly normal. Other: None. IMPRESSION: 1. Two small foci of patchy enhancement involving the cortical gray matter of the right parietal lobe and  right cerebellar vermis as above, suspicious for metastatic disease. No associated edema or mass effect. 2. 9 mm soft tissue nodule at the right parietal scalp, indeterminate. Metastatic implant not excluded. Correlation with physical exam recommended. 3. Underlying mild chronic microvascular ischemic disease. Electronically Signed   By: Jeannine Boga M.D.   On: 12/10/2019 03:04   IR US Guide Bx Asp/Drain  Result Date: 11/19/2019 INDICATION: Diffuse omental masses, concern versus carcinoma EXAM: Ultrasound midline omental mass core biopsy MEDICATIONS: 1% lidocaine ANESTHESIA/SEDATION: Moderate (conscious) sedation was employed during this procedure. A total of Versed 1.0 mg and Fentanyl 70 mcg was administered intravenously. Moderate Sedation Time: 11 minutes. The patient's level of consciousness and vital signs were monitored continuously by radiology nursing throughout the procedure under my direct supervision. FLUOROSCOPY TIME:  Fluoroscopy Time: None. COMPLICATIONS: None immediate. PROCEDURE: Informed written consent was obtained from the patient after a thorough discussion of the procedural risks, benefits and alternatives. All questions were addressed. Maximal Sterile Barrier Technique was utilized including caps, mask, sterile gowns, sterile gloves, sterile drape, hand hygiene and skin antiseptic. A timeout was performed prior to the initiation of the procedure. Previous imaging reviewed. Preliminary ultrasound performed. A midline subhepatic omental mass was localized and marked for an epigastric approach. This was correlated with the CT. Under sterile conditions and local anesthesia, a 17 gauge guide needle was advanced to the midline omental mass. Needle position confirmed with ultrasound. Images obtained for documentation. 18 gauge core biopsies obtained through the access. Samples were intact and non fragmented. Samples placed in saline. Needle tract occluded with Gel-Foam. Postprocedure  imaging demonstrates no hemorrhage or hematoma. Patient tolerated biopsy well. IMPRESSION: Successful ultrasound midline omental mass 18 gauge core biopsies. Electronically Signed   By: Jerilynn Mages.  Shick M.D.   On: 11/19/2019 12:50    ASSESSMENT & PLAN Aaron Davidson 49 y.o. male with medical history significant  for metastatic clear cell carcinoma, likely of kidney origin who presents for a follow up visit. Today is Cycle 1 Day 1 of Pembrolizumab/Axitinib.   Today Aaron Davidson continues to be in pain from his abdominal distention as well as his hemorrhoids.  He denies having any issues with headache, vision changes, shortness of breath, or chest pain.  He has not been particularly mobile given his pain, but he is been eating and is willing and able to proceed with treatment today.  Previously we had a discussion focused on the diagnosis of RCC and the poor prognosis of metastatic disease.  I noted that all treatments moving forward will be palliative in nature and designed to shrink the tumor and prolong life, but that there is no curative therapy available for this cancer.  The patient and his mother voiced their understanding of these unfortunate circumstances moving forward.  # Metastatic Clear Cell Carcinoma, Likely of Kidney Origin --several features of this patient's malignancy are unusual, including the pattern of spread, the lack of a clear definitive primary in the renal system, and omental caking/brain mets/lung lesion.  --pathology findings support RCC the strongest, additionally this spread (brain, lung, omentum) can be seen with RCC.  --given the extent of disease, the patient's prognosis is very poor. Comfort based care would not be unreasonable, but given this patient's young age I would agree with pursuing a course of treatment --proceeding with treatment for presumed Renal Cell Carcinoma with pembrolizumab and axitinib. Cycle 1 Day 1 today.  --continue to monitor closely. Labs next week  with f/u visit between now and his next cycle.  #Enlarged/Protruding Hemorrhoids --previously reviewed by surgery. Requesting re-evaluation as these appear to be at high risk for bleeding/infection  #Boil/Cytic Lesion on Back --large fluid fill cystic lesion without drainage. Non-tender with no surrounding erythema --no signs/symptoms of systemic infection.  --requesting evaluation by surgical service for I/D. They will see him in their urgent clinic on Monday.   #Symptom Management --zofran and compazine for nausea management --precautions for HFS on axitinib -- pain control with oxycodone 5mg  q4H PRN  #GOC --given the extent of disease, the patient's prognosis is very poor. Comfort based care would not be unreasonable, but given this patient's young age I would agree with pursuing a course of treatment --all treatments at this time are to extend life and reduce tumor burden. There is no curative therapy   No orders of the defined types were placed in this encounter.   All questions were answered. The patient knows to call the clinic with any problems, questions or concerns.  A total of more than 30 minutes were spent on this encounter and over half of that time was spent on counseling and coordination of care as outlined above.   Ledell Peoples, MD Department of Hematology/Oncology [place] at [facility] Phone: [phone] Pager: [phone] Email: [email or web address]  12/13/2019 1:17 PM

## 2019-12-15 ENCOUNTER — Encounter: Payer: Self-pay | Admitting: Hematology and Oncology

## 2019-12-15 ENCOUNTER — Other Ambulatory Visit: Payer: Self-pay | Admitting: Registered Nurse

## 2019-12-15 ENCOUNTER — Other Ambulatory Visit: Payer: Self-pay | Admitting: Internal Medicine

## 2019-12-15 ENCOUNTER — Telehealth: Payer: Self-pay | Admitting: *Deleted

## 2019-12-15 DIAGNOSIS — F411 Generalized anxiety disorder: Secondary | ICD-10-CM

## 2019-12-15 DIAGNOSIS — R1032 Left lower quadrant pain: Secondary | ICD-10-CM

## 2019-12-15 NOTE — Telephone Encounter (Signed)
Patient is requesting a refill of the following medications: Requested Prescriptions   Pending Prescriptions Disp Refills  . dicyclomine (BENTYL) 10 MG capsule [Pharmacy Med Name: DICYCLOMINE 10MG  CAPSULES] 45 capsule 0    Sig: TAKE 1 CAPSULE(10 MG) BY MOUTH FOUR TIMES DAILY BEFORE MEALS AND AT BEDTIME  . ALPRAZolam (XANAX) 0.25 MG tablet [Pharmacy Med Name: ALPRAZOLAM 0.25MG  TABLETS] 30 tablet     Sig: TAKE 1 TABLET(0.25 MG) BY MOUTH AT BEDTIME AS NEEDED FOR ANXIETY    Date of patient request: 12/15/2019 Last office visit: 11/25/2019 Date of last refill: 11/11/2019 Last refill amount:30 tablets Follow up time period per chart:NA

## 2019-12-15 NOTE — Progress Notes (Signed)
Pt is approved for the $1000 [employer] grant.  

## 2019-12-16 ENCOUNTER — Encounter: Payer: Self-pay | Admitting: Registered Nurse

## 2019-12-16 ENCOUNTER — Other Ambulatory Visit: Payer: Self-pay | Admitting: Internal Medicine

## 2019-12-17 ENCOUNTER — Telehealth: Payer: Self-pay | Admitting: Hematology and Oncology

## 2019-12-17 ENCOUNTER — Telehealth: Payer: Self-pay | Admitting: *Deleted

## 2019-12-17 NOTE — Telephone Encounter (Signed)
Scheduled per los and per RN. Called and spoke with patient. Confirmed appt

## 2019-12-17 NOTE — Telephone Encounter (Signed)
Received call from [facility], [place]. She states that a family member called there with a referral for patient.. She states that pt is aware of the referral. Pt still receiving active treatment for his Renal Cell Carcinoma. TCT patient and spoke with him. Discussed Hospice services with him and advised that Hospice does not accept patients who are receiving active chemotherapy/immunotherapy.  He states he did not know that. Asked patient if he still wanted to proceed with his treatments. He said he did and to cancel the referral to Hospice. Informed him that I would call hospice back and relay his wishes. Asked Isamar how he was doing with pain control. He states his PCP started him on Oxycodone 15 mg every 4 hours. He states he takes it every 4 hours. He states he will need refill by the weekend. Advised that he would need to call Maximiano Coss, NP for refills as he is the prescribing provider. Jacquise voiced understanding. Lycan states he did go to the [facility] on 12/15/19 and they were able to drain the abscess on his lower back. It is still draining and he has help to change the dressing daily.  He states his hemorrhoids  Are still very painful and will see the surgeon for that issue in a couple of weeks. Reviewed his upcoming appts with him. He voiced understanding to all of the above.  TCT [facility] and cancelled referral per pt request.

## 2019-12-18 ENCOUNTER — Telehealth: Payer: Self-pay

## 2019-12-18 ENCOUNTER — Encounter: Payer: Self-pay | Admitting: Registered Nurse

## 2019-12-18 DIAGNOSIS — Z515 Encounter for palliative care: Secondary | ICD-10-CM

## 2019-12-18 DIAGNOSIS — R1032 Left lower quadrant pain: Secondary | ICD-10-CM

## 2019-12-18 NOTE — Telephone Encounter (Signed)
No additional note required.

## 2019-12-19 ENCOUNTER — Inpatient Hospital Stay: Payer: 59

## 2019-12-19 ENCOUNTER — Other Ambulatory Visit: Payer: Self-pay | Admitting: Registered Nurse

## 2019-12-19 ENCOUNTER — Other Ambulatory Visit: Payer: 59

## 2019-12-19 DIAGNOSIS — R1032 Left lower quadrant pain: Secondary | ICD-10-CM

## 2019-12-19 DIAGNOSIS — Z515 Encounter for palliative care: Secondary | ICD-10-CM

## 2019-12-19 MED ORDER — OXYCODONE HCL 15 MG PO TABS: 15.0000 mg | ORAL_TABLET | ORAL | 0 refills | Status: AC | PRN

## 2019-12-22 ENCOUNTER — Telehealth: Payer: Self-pay | Admitting: *Deleted

## 2019-12-22 NOTE — Telephone Encounter (Signed)
Received call from pt's mother.  She had several concerns. Pt has not had a BM x 4 days, despite taking Senna S. He is still having severe hemorrhoid problems and is seeing the surgeon on 12/18/2019 Advised to increase Senna S to 2 in the the morning and 2 in the evening. Also advised that he take 1/2 bottle of Mag. Citrate today and if no BM, take the other 1/2 bottle.  She voiced understanding.  He sees the surgeon on Wednesday re: hemorrhoids.    [name] also asking for Palliative Care Referral for symptom management.  This nurse will make referral today.  Pt is scheduled for his 2nd chemo on 01/02/20 but does not have an appt for labs or MD prior to that. Scheduling message sent for labs and MD appt on 12/07/2019 @ 9:30 am. His mother understands the need for appt prior to next chemo but she is unsure how Roshan will feel after Wednesday appt with surgeon. Advised to call to let me know how he is Thursday am. She voiced understanding.

## 2019-12-23 ENCOUNTER — Telehealth: Payer: Self-pay

## 2019-12-23 NOTE — Progress Notes (Addendum)
Telemedicine Encounter- SOAP NOTE Established Patient  This telephone encounter was conducted with the patient's (or proxy's) verbal consent via audio telecommunications: yes  Patient was instructed to have this encounter in a suitably private space; and to only have persons present to whom they give permission to participate. In addition, patient identity was confirmed by use of name plus two identifiers (DOB and address).  I discussed the limitations, risks, security and privacy concerns of performing an evaluation and management service by telephone and the availability of in person appointments. I also discussed with the patient that there may be a patient responsible charge related to this service. The patient expressed understanding and agreed to proceed.  I spent a total of 15 minutes talking with the patient or their proxy.  Patient at home Provider in office  Chief Complaint  Patient presents with  . Abdominal Pain    pt is complaining of persistent stomach discomfort, GI called and told him he could have colonoscopy till his hemroids are taken care of .     Subjective   Aaron Davidson is a 49 y.o. established patient. Telephone visit today for abdominal pain  HPI Was seen briefly for this before - discussed that he needs a colonoscopy. Concern for intenstinal abnormality, cannot rule out ca dx. Overdue for screening examination anyway Gi has told him that he needs to get hemorrhoids removed before colonoscopy d/t their severity Has multiple internal and external hemorrhoids and anal fissures. Will refer to gen surg. Important that this happens soon.   Patient Active Problem List   Diagnosis Date Noted  . Metastatic renal cell carcinoma ([facility]) 12/02/2019    No past medical history on file.  Current Outpatient Medications  Medication Sig Dispense Refill  . allopurinol (ZYLOPRIM) 300 MG tablet Take 1 tablet (300 mg total) by mouth daily. 30 tablet 1  . ALPRAZolam  (XANAX) 0.25 MG tablet TAKE 1 TABLET(0.25 MG) BY MOUTH AT BEDTIME AS NEEDED FOR ANXIETY 30 tablet 0  . dicyclomine (BENTYL) 10 MG capsule TAKE 1 CAPSULE(10 MG) BY MOUTH FOUR TIMES DAILY BEFORE MEALS AND AT BEDTIME 45 capsule 0  . diltiazem 2 % GEL Apply 1 application topically 4 (four) times daily.    Marland Kitchen docusate sodium (COLACE) 100 MG capsule Take 100 mg by mouth daily as needed for mild constipation.    . gabapentin (NEURONTIN) 600 MG tablet Take 1 tablet (600 mg total) by mouth 2 (two) times daily. 90 tablet 0  . INLYTA 5 MG tablet Take 1 tablet by mouth every 12 hours 60 tablet 0  . Lidocaine-Hydrocort, Perianal, 3-0.5 % CREA Place 1 application rectally 2 (two) times daily. (Patient taking differently: Place 1 application rectally daily as needed (Hemorrhoids). ) 28.3 g 5  . ondansetron (ZOFRAN-ODT) 4 MG disintegrating tablet Take 1 tablet (4 mg total) by mouth 4 (four) times daily as needed. 40 tablet 3  . ondansetron (ZOFRAN-ODT) 8 MG disintegrating tablet Take 1 tablet (8 mg total) by mouth every 8 (eight) hours as needed for nausea or vomiting. 20 tablet 0  . oxyCODONE (ROXICODONE) 15 MG immediate release tablet Take 1 tablet (15 mg total) by mouth every 4 (four) hours as needed for pain. 120 tablet 0  . prochlorperazine (COMPAZINE) 10 MG tablet Take 1 tablet (10 mg total) by mouth every 6 (six) hours as needed for nausea or vomiting. 30 tablet 0  . sildenafil (VIAGRA) 25 MG tablet Take 2 tablets (50 mg total) by mouth daily  as needed for erectile dysfunction. (Patient taking differently: Take 25 mg by mouth daily as needed for erectile dysfunction. ) 30 tablet 3   No current facility-administered medications for this visit.    Allergies  Allergen Reactions  . No Known Allergies     Social History   Socioeconomic History  . Marital status: Divorced    Spouse name: Not on file  . Number of children: Not on file  . Years of education: Not on file  . Highest education level: Not on  file  Occupational History  . Not on file  Tobacco Use  . Smoking status: Current Some Day Smoker    Packs/day: 2.00    Years: 38.00    Pack years: 76.00    Types: Cigarettes  . Smokeless tobacco: Never Used  Substance and Sexual Activity  . Alcohol use: Yes    Alcohol/week: 1.0 standard drink    Types: 1 Standard drinks or equivalent per week    Comment: rare  . Drug use: No  . Sexual activity: Not on file  Other Topics Concern  . Not on file  Social History Narrative  . Not on file   Social Determinants of Health   Financial Resource Strain:   . Difficulty of Paying Living Expenses: Not on file  Food Insecurity:   . Worried About Charity fundraiser in the Last Year: Not on file  . Ran Out of Food in the Last Year: Not on file  Transportation Needs:   . Lack of Transportation (Medical): Not on file  . Lack of Transportation (Non-Medical): Not on file  Physical Activity:   . Days of Exercise per Week: Not on file  . Minutes of Exercise per Session: Not on file  Stress:   . Feeling of Stress : Not on file  Social Connections:   . Frequency of Communication with Friends and Family: Not on file  . Frequency of Social Gatherings with Friends and Family: Not on file  . Attends Religious Services: Not on file  . Active Member of Clubs or Organizations: Not on file  . Attends Archivist Meetings: Not on file  . Marital Status: Not on file  Intimate Partner Violence:   . Fear of Current or Ex-Partner: Not on file  . Emotionally Abused: Not on file  . Physically Abused: Not on file  . Sexually Abused: Not on file    ROS Per hpi   Objective   Vitals as reported by the patient: There were no vitals filed for this visit.  Aaron Davidson was seen today for abdominal pain.  Diagnoses and all orders for this visit:  Erectile dysfunction, unspecified erectile dysfunction type -     Discontinue: sildenafil (VIAGRA) 25 MG tablet; Take 1 tablet (25 mg total) by mouth  daily as needed for erectile dysfunction.  Vitamin D deficiency -     Discontinue: Vitamin D, Ergocalciferol, (DRISDOL) 1.25 MG (50000 UNIT) CAPS capsule; Take 1 capsule (50,000 Units total) by mouth every 7 (seven) days.   PLAN  Refill sildenafil  Vit d refill  gen surg referral to be placed  Patient encouraged to call clinic with any questions, comments, or concerns.  I discussed the assessment and treatment plan with the patient. The patient was provided an opportunity to ask questions and all were answered. The patient agreed with the plan and demonstrated an understanding of the instructions.   The patient was advised to call back or seek an in-person evaluation  if the symptoms worsen or if the condition fails to improve as anticipated.  I provided 15 minutes of non-face-to-face time during this encounter.  Maximiano Coss, NP  Primary Care at [facility]

## 2019-12-23 NOTE — Telephone Encounter (Signed)
Received message from patient's wife to call her back she had a question. Returned call. No answer. Left a message to call back into the office.

## 2019-12-24 ENCOUNTER — Telehealth: Payer: Self-pay | Admitting: *Deleted

## 2019-12-24 NOTE — Telephone Encounter (Signed)
Referral made for Palliative Care Services through [facility] at the request of pt and his mother. Spoke with Erline Levine and she will get things set up.

## 2019-12-25 ENCOUNTER — Emergency Department (HOSPITAL_COMMUNITY): Payer: 59

## 2019-12-25 ENCOUNTER — Inpatient Hospital Stay (HOSPITAL_COMMUNITY)
Admission: EM | Admit: 2019-12-25 | Discharge: 2020-01-02 | DRG: 947 | Disposition: E | Payer: 59 | Attending: Internal Medicine | Admitting: Internal Medicine

## 2019-12-25 ENCOUNTER — Telehealth: Payer: Self-pay | Admitting: *Deleted

## 2019-12-25 ENCOUNTER — Encounter (HOSPITAL_COMMUNITY): Payer: Self-pay | Admitting: Emergency Medicine

## 2019-12-25 ENCOUNTER — Telehealth: Payer: Self-pay | Admitting: Hematology and Oncology

## 2019-12-25 ENCOUNTER — Other Ambulatory Visit: Payer: Self-pay

## 2019-12-25 ENCOUNTER — Other Ambulatory Visit: Payer: Self-pay | Admitting: Hematology and Oncology

## 2019-12-25 ENCOUNTER — Inpatient Hospital Stay: Payer: 59 | Admitting: Hematology and Oncology

## 2019-12-25 ENCOUNTER — Inpatient Hospital Stay: Payer: 59

## 2019-12-25 DIAGNOSIS — Z6825 Body mass index (BMI) 25.0-25.9, adult: Secondary | ICD-10-CM | POA: Diagnosis not present

## 2019-12-25 DIAGNOSIS — E222 Syndrome of inappropriate secretion of antidiuretic hormone: Secondary | ICD-10-CM | POA: Diagnosis present

## 2019-12-25 DIAGNOSIS — I472 Ventricular tachycardia: Secondary | ICD-10-CM | POA: Diagnosis not present

## 2019-12-25 DIAGNOSIS — C7800 Secondary malignant neoplasm of unspecified lung: Secondary | ICD-10-CM | POA: Diagnosis present

## 2019-12-25 DIAGNOSIS — C787 Secondary malignant neoplasm of liver and intrahepatic bile duct: Secondary | ICD-10-CM | POA: Diagnosis present

## 2019-12-25 DIAGNOSIS — F419 Anxiety disorder, unspecified: Secondary | ICD-10-CM | POA: Diagnosis present

## 2019-12-25 DIAGNOSIS — C649 Malignant neoplasm of unspecified kidney, except renal pelvis: Secondary | ICD-10-CM

## 2019-12-25 DIAGNOSIS — E162 Hypoglycemia, unspecified: Secondary | ICD-10-CM | POA: Diagnosis present

## 2019-12-25 DIAGNOSIS — G893 Neoplasm related pain (acute) (chronic): Secondary | ICD-10-CM | POA: Diagnosis present

## 2019-12-25 DIAGNOSIS — Z515 Encounter for palliative care: Secondary | ICD-10-CM | POA: Diagnosis not present

## 2019-12-25 DIAGNOSIS — Z66 Do not resuscitate: Secondary | ICD-10-CM

## 2019-12-25 DIAGNOSIS — C786 Secondary malignant neoplasm of retroperitoneum and peritoneum: Secondary | ICD-10-CM | POA: Diagnosis present

## 2019-12-25 DIAGNOSIS — J810 Acute pulmonary edema: Secondary | ICD-10-CM | POA: Diagnosis not present

## 2019-12-25 DIAGNOSIS — J9601 Acute respiratory failure with hypoxia: Secondary | ICD-10-CM | POA: Diagnosis not present

## 2019-12-25 DIAGNOSIS — E875 Hyperkalemia: Secondary | ICD-10-CM | POA: Diagnosis present

## 2019-12-25 DIAGNOSIS — Z7189 Other specified counseling: Secondary | ICD-10-CM

## 2019-12-25 DIAGNOSIS — Z8249 Family history of ischemic heart disease and other diseases of the circulatory system: Secondary | ICD-10-CM

## 2019-12-25 DIAGNOSIS — C7931 Secondary malignant neoplasm of brain: Secondary | ICD-10-CM | POA: Diagnosis present

## 2019-12-25 DIAGNOSIS — G4700 Insomnia, unspecified: Secondary | ICD-10-CM | POA: Diagnosis not present

## 2019-12-25 DIAGNOSIS — F17210 Nicotine dependence, cigarettes, uncomplicated: Secondary | ICD-10-CM | POA: Diagnosis present

## 2019-12-25 DIAGNOSIS — K644 Residual hemorrhoidal skin tags: Secondary | ICD-10-CM | POA: Diagnosis present

## 2019-12-25 DIAGNOSIS — R627 Adult failure to thrive: Secondary | ICD-10-CM | POA: Diagnosis present

## 2019-12-25 DIAGNOSIS — K5900 Constipation, unspecified: Secondary | ICD-10-CM | POA: Diagnosis present

## 2019-12-25 DIAGNOSIS — E860 Dehydration: Secondary | ICD-10-CM | POA: Diagnosis present

## 2019-12-25 DIAGNOSIS — D696 Thrombocytopenia, unspecified: Secondary | ICD-10-CM | POA: Diagnosis present

## 2019-12-25 DIAGNOSIS — Z79899 Other long term (current) drug therapy: Secondary | ICD-10-CM

## 2019-12-25 DIAGNOSIS — R64 Cachexia: Secondary | ICD-10-CM | POA: Diagnosis present

## 2019-12-25 DIAGNOSIS — R109 Unspecified abdominal pain: Secondary | ICD-10-CM | POA: Diagnosis present

## 2019-12-25 DIAGNOSIS — R52 Pain, unspecified: Secondary | ICD-10-CM | POA: Diagnosis not present

## 2019-12-25 DIAGNOSIS — Z20822 Contact with and (suspected) exposure to covid-19: Secondary | ICD-10-CM | POA: Diagnosis present

## 2019-12-25 DIAGNOSIS — R54 Age-related physical debility: Secondary | ICD-10-CM | POA: Diagnosis present

## 2019-12-25 HISTORY — DX: Malignant (primary) neoplasm, unspecified: C80.1

## 2019-12-25 LAB — URINALYSIS, ROUTINE W REFLEX MICROSCOPIC
Bilirubin Urine: NEGATIVE
Glucose, UA: NEGATIVE mg/dL
Hgb urine dipstick: NEGATIVE
Ketones, ur: 20 mg/dL — AB
Leukocytes,Ua: NEGATIVE
Nitrite: NEGATIVE
Protein, ur: NEGATIVE mg/dL
Specific Gravity, Urine: 1.017 (ref 1.005–1.030)
pH: 6 (ref 5.0–8.0)

## 2019-12-25 LAB — CBG MONITORING, ED
Glucose-Capillary: 63 mg/dL — ABNORMAL LOW (ref 70–99)
Glucose-Capillary: 66 mg/dL — ABNORMAL LOW (ref 70–99)
Glucose-Capillary: 82 mg/dL (ref 70–99)
Glucose-Capillary: 72 mg/dL (ref 70–99)
Glucose-Capillary: 61 mg/dL — ABNORMAL LOW (ref 70–99)
Glucose-Capillary: 157 mg/dL — ABNORMAL HIGH (ref 70–99)

## 2019-12-25 LAB — CBC WITH DIFFERENTIAL/PLATELET
Abs Immature Granulocytes: 0.07 10*3/uL (ref 0.00–0.07)
Basophils Absolute: 0 10*3/uL (ref 0.0–0.1)
Basophils Relative: 0 %
Eosinophils Absolute: 0 10*3/uL (ref 0.0–0.5)
Eosinophils Relative: 0 %
HCT: 45.1 % (ref 39.0–52.0)
Hemoglobin: 15.8 g/dL (ref 13.0–17.0)
Immature Granulocytes: 1 %
Lymphocytes Relative: 9 %
Lymphs Abs: 1 10*3/uL (ref 0.7–4.0)
MCH: 33 pg (ref 26.0–34.0)
MCHC: 35 g/dL (ref 30.0–36.0)
MCV: 94.2 fL (ref 80.0–100.0)
Monocytes Absolute: 0.8 10*3/uL (ref 0.1–1.0)
Monocytes Relative: 7 %
Neutro Abs: 9.2 10*3/uL — ABNORMAL HIGH (ref 1.7–7.7)
Neutrophils Relative %: 83 %
Platelets: 138 10*3/uL — ABNORMAL LOW (ref 150–400)
RBC: 4.79 MIL/uL (ref 4.22–5.81)
RDW: 14.3 % (ref 11.5–15.5)
WBC: 11.1 10*3/uL — ABNORMAL HIGH (ref 4.0–10.5)
nRBC: 0.2 % (ref 0.0–0.2)

## 2019-12-25 LAB — COMPREHENSIVE METABOLIC PANEL
ALT: 23 U/L (ref 0–44)
AST: 49 U/L — ABNORMAL HIGH (ref 15–41)
Albumin: 2.7 g/dL — ABNORMAL LOW (ref 3.5–5.0)
Alkaline Phosphatase: 4252 U/L — ABNORMAL HIGH (ref 38–126)
Anion gap: 18 — ABNORMAL HIGH (ref 5–15)
BUN: 27 mg/dL — ABNORMAL HIGH (ref 6–20)
CO2: 19 mmol/L — ABNORMAL LOW (ref 22–32)
Calcium: 7.8 mg/dL — ABNORMAL LOW (ref 8.9–10.3)
Chloride: 93 mmol/L — ABNORMAL LOW (ref 98–111)
Creatinine, Ser: 1.04 mg/dL (ref 0.61–1.24)
GFR calc Af Amer: >60 mL/min (ref >60)
GFR calc non Af Amer: >60 mL/min (ref >60)
Glucose, Bld: 62 mg/dL — ABNORMAL LOW (ref 70–99)
Potassium: 5.3 mmol/L — ABNORMAL HIGH (ref 3.5–5.1)
Sodium: 130 mmol/L — ABNORMAL LOW (ref 135–145)
Total Bilirubin: 1.7 mg/dL — ABNORMAL HIGH (ref 0.3–1.2)
Total Protein: 5.7 g/dL — ABNORMAL LOW (ref 6.5–8.1)

## 2019-12-25 LAB — I-STAT CHEM 8, ED
BUN: 26 mg/dL — ABNORMAL HIGH (ref 6–20)
Calcium, Ion: 1.01 mmol/L — ABNORMAL LOW (ref 1.15–1.40)
Chloride: 93 mmol/L — ABNORMAL LOW (ref 98–111)
Creatinine, Ser: 0.9 mg/dL (ref 0.61–1.24)
Glucose, Bld: 59 mg/dL — ABNORMAL LOW (ref 70–99)
HCT: 47 % (ref 39.0–52.0)
Hemoglobin: 16 g/dL (ref 13.0–17.0)
Potassium: 5.3 mmol/L — ABNORMAL HIGH (ref 3.5–5.1)
Sodium: 129 mmol/L — ABNORMAL LOW (ref 135–145)
TCO2: 21 mmol/L — ABNORMAL LOW (ref 22–32)

## 2019-12-25 LAB — RESPIRATORY PANEL BY RT PCR (FLU A&B, COVID)
Influenza A by PCR: NEGATIVE
Influenza B by PCR: NEGATIVE
SARS Coronavirus 2 by RT PCR: NEGATIVE

## 2019-12-25 LAB — LACTIC ACID, PLASMA
Lactic Acid, Venous: 3.1 mmol/L (ref 0.5–1.9)
Lactic Acid, Venous: 2.6 mmol/L (ref 0.5–1.9)

## 2019-12-25 LAB — LIPASE, BLOOD: Lipase: 20 U/L (ref 11–51)

## 2019-12-25 LAB — LACTATE DEHYDROGENASE: LDH: 315 U/L — ABNORMAL HIGH (ref 98–192)

## 2019-12-25 MED ORDER — DICYCLOMINE HCL 10 MG PO CAPS
10.0000 mg | ORAL_CAPSULE | Freq: Three times a day (TID) | ORAL | Status: DC
  Administered 2019-12-25 – 2019-12-27 (×8): 10 mg via ORAL
  Filled 2019-12-25 (×8): qty 1

## 2019-12-25 MED ORDER — DEXTROSE 50 % IV SOLN
25.0000 mL | Freq: Once | INTRAVENOUS | Status: AC
  Administered 2019-12-25: 25 mL via INTRAVENOUS
  Filled 2019-12-25: qty 50

## 2019-12-25 MED ORDER — ONDANSETRON HCL 4 MG PO TABS: 4.0000 mg | ORAL_TABLET | Freq: Four times a day (QID) | ORAL | Status: DC | PRN

## 2019-12-25 MED ORDER — HYDROCORT-PRAMOXINE (PERIANAL) 2.5-1 % EX CREA
1.0000 "application " | TOPICAL_CREAM | Freq: Every day | CUTANEOUS | Status: DC | PRN
  Filled 2019-12-25 (×2): qty 30

## 2019-12-25 MED ORDER — HYDROMORPHONE HCL 1 MG/ML IJ SOLN
1.0000 mg | Freq: Once | INTRAMUSCULAR | Status: AC
  Administered 2019-12-25: 1 mg via INTRAVENOUS
  Filled 2019-12-25: qty 1

## 2019-12-25 MED ORDER — DEXTROSE 50 % IV SOLN: 25.0000 mL | Freq: Once | INTRAVENOUS | Status: DC

## 2019-12-25 MED ORDER — LIDOCAINE HCL URETHRAL/MUCOSAL 2 % EX GEL
1.0000 "application " | Freq: Once | CUTANEOUS | Status: AC
  Administered 2019-12-25: 1 via TOPICAL
  Filled 2019-12-25: qty 11

## 2019-12-25 MED ORDER — ONDANSETRON HCL 4 MG/2ML IJ SOLN
4.0000 mg | Freq: Four times a day (QID) | INTRAMUSCULAR | Status: DC | PRN
  Administered 2019-12-26: 4 mg via INTRAVENOUS
  Filled 2019-12-25: qty 2

## 2019-12-25 MED ORDER — DILTIAZEM GEL 2 %
1.0000 "application " | Freq: Four times a day (QID) | CUTANEOUS | Status: DC | PRN
  Filled 2019-12-25: qty 30

## 2019-12-25 MED ORDER — DOCUSATE SODIUM 100 MG PO CAPS
100.0000 mg | ORAL_CAPSULE | Freq: Two times a day (BID) | ORAL | Status: DC
  Administered 2019-12-25 – 2019-12-27 (×4): 100 mg via ORAL
  Filled 2019-12-25 (×4): qty 1

## 2019-12-25 MED ORDER — IOHEXOL 300 MG/ML  SOLN
100.0000 mL | Freq: Once | INTRAMUSCULAR | Status: AC | PRN
  Administered 2019-12-25: 100 mL via INTRAVENOUS

## 2019-12-25 MED ORDER — HYDROMORPHONE HCL 1 MG/ML IJ SOLN
1.0000 mg | INTRAMUSCULAR | Status: DC | PRN
  Administered 2019-12-25 – 2019-12-26 (×5): 1 mg via INTRAVENOUS
  Filled 2019-12-25 (×5): qty 1

## 2019-12-25 MED ORDER — MORPHINE SULFATE ER 15 MG PO TBCR
15.0000 mg | EXTENDED_RELEASE_TABLET | Freq: Two times a day (BID) | ORAL | Status: DC
  Administered 2019-12-25 – 2019-12-26 (×2): 15 mg via ORAL
  Filled 2019-12-25 (×2): qty 1

## 2019-12-25 MED ORDER — ACETAMINOPHEN 500 MG PO TABS: 500.0000 mg | ORAL_TABLET | Freq: Four times a day (QID) | ORAL | Status: DC | PRN

## 2019-12-25 MED ORDER — LACTATED RINGERS IV BOLUS
1000.0000 mL | Freq: Once | INTRAVENOUS | Status: AC
  Administered 2019-12-25: 1000 mL via INTRAVENOUS

## 2019-12-25 MED ORDER — GABAPENTIN 300 MG PO CAPS
600.0000 mg | ORAL_CAPSULE | Freq: Two times a day (BID) | ORAL | Status: DC
  Administered 2019-12-25 – 2019-12-27 (×4): 600 mg via ORAL
  Filled 2019-12-25 (×4): qty 2

## 2019-12-25 MED ORDER — SODIUM CHLORIDE (PF) 0.9 % IJ SOLN
INTRAMUSCULAR | Status: AC
  Filled 2019-12-25: qty 50

## 2019-12-25 MED ORDER — DEXTROSE-NACL 5-0.9 % IV SOLN: INTRAVENOUS | Status: DC

## 2019-12-25 MED ORDER — ALPRAZOLAM 0.25 MG PO TABS
0.2500 mg | ORAL_TABLET | Freq: Every evening | ORAL | Status: DC | PRN
  Administered 2019-12-25 – 2019-12-26 (×2): 0.25 mg via ORAL
  Filled 2019-12-25 (×2): qty 1

## 2019-12-25 MED ORDER — DEXTROSE-NACL 5-0.45 % IV SOLN: INTRAVENOUS | Status: DC

## 2019-12-25 MED ORDER — SENNOSIDES-DOCUSATE SODIUM 8.6-50 MG PO TABS
1.0000 | ORAL_TABLET | Freq: Two times a day (BID) | ORAL | Status: DC
  Administered 2019-12-25 – 2019-12-27 (×4): 1 via ORAL
  Filled 2019-12-25 (×4): qty 1

## 2019-12-25 MED ORDER — SODIUM CHLORIDE 0.9 % IV BOLUS
1000.0000 mL | Freq: Once | INTRAVENOUS | Status: AC
  Administered 2019-12-25: 1000 mL via INTRAVENOUS

## 2019-12-25 MED ORDER — BISACODYL 5 MG PO TBEC: 5.0000 mg | DELAYED_RELEASE_TABLET | Freq: Every day | ORAL | Status: DC | PRN

## 2019-12-25 NOTE — Telephone Encounter (Signed)
TCT pt's mother as pt did not show for his 10 am appt.with Dr. Lorenso Courier. Spoke with Aaron Davidson and she is at her son's home. She states he is in 'bad shape'. He is having excruciating pain, has not had a BM in several days, perhaps a week.  He has extremely large prolapsed hemorrhoids-worsened by bulky abdominal tumor.  He is nauseated, vomiting at times. He cannot eat. He has tried to take Mag Citrate to help with BM but cannot keep it down. He has not eaten much in the last few days.  Aaron Davidson is very worried about him. He is weak, lethargic. He is taking his pain meds and that is about all he can do. Advised to have pt transported [facility] ED ASAP. Aaron Davidson states she will ask pt. She called back and said he agreed to go to ED an she has called EMS and they are there currently. He is in a large amount of pain and it is painful for pt to be moved. EMT's are addressing this and then will transport.  Dr. Lorenso Courier aware and he notified ED of pt's imminent arrival.

## 2019-12-25 NOTE — ED Triage Notes (Signed)
Pt came from home via EMS C/c: constipation  Recent dx of stage 4 renal cell cancer that has spread to liver and legs Hasn't had a BM for the past 5 days, NPO for the past 5 days. Decreased mobility. Orthostatic changes w/ EMS. CBG: 48 before 10g of D10. 500 cc of NS Weakness, dizziness.   A&o X 4  18 in Left AC.

## 2019-12-25 NOTE — ED Provider Notes (Signed)
Aaron Davidson Provider Note   CSN: [id number] Arrival date & time: 12/29/2019  1142     History No chief complaint on file.   Aaron Davidson is a 49 y.o. male.  HPI 49 year old male with metastatic cancer presents with constipation, vomiting and weakness.  Brought in by EMS.  The patient was hypoglycemic with them and given D10.  The patient has not been eating for the past 5 days or so and has been constipated.  He has chronic abdominal pain and distention from his cancer but feels like he is also having new pain now.  He thinks he might of had fevers but has not checked.  He denies cough or shortness of breath.  He has large hemorrhoids which she states are very painful at this time. His oncologist recommended he come to the ER, was due for a visit today.   Past Medical History:  Diagnosis Date  . Cancer [facility])     Patient Active Problem List   Diagnosis Date Noted  . Metastatic renal cell carcinoma ([place]) 12/02/2019    Past Surgical History:  Procedure Laterality Date  . DENTAL SURGERY    . HERNIA REPAIR N/A    [employer] 09/09/2019  . INGUINAL HERNIA REPAIR Left 12/13/2015   Procedure: LAPAROSCOPIC LEFT  INGUINAL HERNIA;  Surgeon: Ralene Ok, MD;  Location: [place];  Service: General;  Laterality: Left;  . INSERTION OF MESH Left 12/13/2015   Procedure: INSERTION OF MESH;  Surgeon: Ralene Ok, MD;  Location: [place];  Service: General;  Laterality: Left;  . IR US GUIDE BX ASP/DRAIN  11/19/2019  . KNEE SURGERY Right   . PILONIDAL CYST EXCISION         Family History  Problem Relation Age of Onset  . Heart disease Father     Social History   Tobacco Use  . Smoking status: Current Some Day Smoker    Packs/day: 2.00    Years: 38.00    Pack years: 76.00    Types: Cigarettes  . Smokeless tobacco: Never Used  Substance Use Topics  . Alcohol use: Yes    Alcohol/week: 1.0 standard drink    Types: 1 Standard drinks or equivalent  per week    Comment: rare  . Drug use: No    Home Medications Prior to Admission medications   Medication Sig Start Date End Date Taking? Authorizing Provider  acetaminophen (TYLENOL) 500 MG tablet Take 500 mg by mouth every 6 (six) hours as needed for moderate pain.   Yes [provider]  allopurinol (ZYLOPRIM) 300 MG tablet Take 1 tablet (300 mg total) by mouth daily. 12/12/19  Yes Orson Slick, MD  ALPRAZolam (XANAX) 0.25 MG tablet TAKE 1 TABLET(0.25 MG) BY MOUTH AT BEDTIME AS NEEDED FOR ANXIETY Patient taking differently: Take 0.25 mg by mouth at bedtime.  12/15/19  Yes Maximiano Coss, NP  dicyclomine (BENTYL) 10 MG capsule TAKE 1 CAPSULE(10 MG) BY MOUTH FOUR TIMES DAILY BEFORE MEALS AND AT BEDTIME Patient taking differently: Take 10 mg by mouth in the morning, at noon, in the evening, and at bedtime.  12/15/19  Yes Maximiano Coss, NP  diltiazem 2 % GEL Apply 1 application topically 4 (four) times daily as needed (After using bathroom).    Yes [provider]  gabapentin (NEURONTIN) 600 MG tablet Take 1 tablet (600 mg total) by mouth 2 (two) times daily. 11/25/19  Yes Maximiano Coss, NP  INLYTA 5 MG tablet Take 1  tablet by mouth every 12 hours Patient taking differently: Take 5 mg by mouth every 12 (twelve) hours.  12/11/19  Yes Orson Slick, MD  Lidocaine-Hydrocort, Perianal, 3-0.5 % CREA Place 1 application rectally 2 (two) times daily. Patient taking differently: Place 1 application rectally daily as needed (Hemorrhoids).  10/24/19  Yes Maximiano Coss, NP  ondansetron (ZOFRAN-ODT) 4 MG disintegrating tablet Take 1 tablet (4 mg total) by mouth 4 (four) times daily as needed. Patient taking differently: Take 4 mg by mouth 4 (four) times daily as needed for nausea or vomiting.  11/25/19  Yes Maximiano Coss, NP  oxyCODONE (ROXICODONE) 15 MG immediate release tablet Take 1 tablet (15 mg total) by mouth every 4 (four) hours as needed for pain. 12/19/19  Yes Maximiano Coss, NP  Sennosides-Docusate Sodium (SENNA-S PO) Take 1 tablet by mouth in the morning and at bedtime.   Yes [provider]  ondansetron (ZOFRAN-ODT) 8 MG disintegrating tablet Take 1 tablet (8 mg total) by mouth every 8 (eight) hours as needed for nausea or vomiting. Patient not taking: Reported on 12/10/2019 11/25/19   Maximiano Coss, NP  prochlorperazine (COMPAZINE) 10 MG tablet Take 1 tablet (10 mg total) by mouth every 6 (six) hours as needed for nausea or vomiting. Patient not taking: Reported on 12/28/2019 12/02/19   Orson Slick, MD  sildenafil (VIAGRA) 25 MG tablet Take 2 tablets (50 mg total) by mouth daily as needed for erectile dysfunction. Patient not taking: Reported on 12/12/2019 09/19/19   Maximiano Coss, NP    Allergies    No known allergies  Review of Systems   Review of Systems  Constitutional: Positive for fever.  Respiratory: Negative for shortness of breath.   Gastrointestinal: Positive for abdominal distention, abdominal pain, constipation and vomiting.  Neurological: Positive for weakness.  All other systems reviewed and are negative.   Physical Exam Updated Vital Signs BP 120/84   Pulse (!) 114   Temp (!) 97.5 F (36.4 C) (Oral)   Resp 16   Ht 5\' 11"  (1.803 m)   Wt 82 kg   SpO2 97%   BMI 25.21 kg/m   Physical Exam Vitals and nursing note reviewed.  Constitutional:      General: He is in acute distress (in pain).     Appearance: He is well-developed.  HENT:     Head: Normocephalic and atraumatic.     Right Ear: External ear normal.     Left Ear: External ear normal.     Nose: Nose normal.  Eyes:     General:        Right eye: No discharge.        Left eye: No discharge.  Cardiovascular:     Rate and Rhythm: Regular rhythm. Tachycardia present.     Heart sounds: Normal heart sounds.  Pulmonary:     Effort: Pulmonary effort is normal.     Breath sounds: Normal breath sounds.  Abdominal:     General: There is distension.      Palpations: Abdomen is soft.     Tenderness: There is generalized abdominal tenderness.  Genitourinary:    Comments: Patient has multiple hemorrhoids, no thrombosis. Very tender on rectal exam, could not tolerate digital exam Musculoskeletal:     Cervical back: Neck supple.  Skin:    General: Skin is warm and dry.  Neurological:     Mental Status: He is alert.  Psychiatric:        Mood and  Affect: Mood is not anxious.     ED Results / Procedures / Treatments   Labs (all labs ordered are listed, but only abnormal results are displayed) Labs Reviewed  COMPREHENSIVE METABOLIC PANEL - Abnormal; Notable for the following components:      Result Value   Sodium 130 (*)    Potassium 5.3 (*)    Chloride 93 (*)    CO2 19 (*)    Glucose, Bld 62 (*)    BUN 27 (*)    Calcium 7.8 (*)    Total Protein 5.7 (*)    Albumin 2.7 (*)    AST 49 (*)    Alkaline Phosphatase 4,252 (*)    Total Bilirubin 1.7 (*)    Anion gap 18 (*)    All other components within normal limits  CBC WITH DIFFERENTIAL/PLATELET - Abnormal; Notable for the following components:   WBC 11.1 (*)    Platelets 138 (*)    Neutro Abs 9.2 (*)    All other components within normal limits  URINALYSIS, ROUTINE W REFLEX MICROSCOPIC - Abnormal; Notable for the following components:   Ketones, ur 20 (*)    All other components within normal limits  LACTATE DEHYDROGENASE - Abnormal; Notable for the following components:   LDH 315 (*)    All other components within normal limits  CBG MONITORING, ED - Abnormal; Notable for the following components:   Glucose-Capillary 66 (*)    All other components within normal limits  CBG MONITORING, ED - Abnormal; Notable for the following components:   Glucose-Capillary 63 (*)    All other components within normal limits  I-STAT CHEM 8, ED - Abnormal; Notable for the following components:   Sodium 129 (*)    Potassium 5.3 (*)    Chloride 93 (*)    BUN 26 (*)    Glucose, Bld 59 (*)     Calcium, Ion 1.01 (*)    TCO2 21 (*)    All other components within normal limits  CBG MONITORING, ED - Abnormal; Notable for the following components:   Glucose-Capillary 61 (*)    All other components within normal limits  RESPIRATORY PANEL BY RT PCR (FLU A&B, COVID)  LIPASE, BLOOD  LACTIC ACID, PLASMA  LACTIC ACID, PLASMA  CBG MONITORING, ED  CBG MONITORING, ED  CBG MONITORING, ED  CBG MONITORING, ED    EKG EKG Interpretation  Date/Time:  Thursday December 25 2019 11:58:18 EDT Ventricular Rate:  112 PR Interval:    QRS Duration: 74 QT Interval:  371 QTC Calculation: 507 R Axis:   60 Text Interpretation: Sinus tachycardia Borderline T abnormalities, anterior leads Prolonged QT interval Interpretation limited secondary to artifact Confirmed by Sherwood Gambler [phone]) on 12/16/2019 12:38:42 PM   Radiology CT ABDOMEN PELVIS W CONTRAST  Result Date: 12/24/2019 CLINICAL DATA:  Stage IV renal cell carcinoma metastatic to lungs and liver, constipation EXAM: CT ABDOMEN AND PELVIS WITH CONTRAST TECHNIQUE: Multidetector CT imaging of the abdomen and pelvis was performed using the standard protocol following bolus administration of intravenous contrast. CONTRAST:  151mL OMNIPAQUE IOHEXOL 300 MG/ML  SOLN COMPARISON:  11/07/2019. FINDINGS: Lower chest: Bibasilar scarring and fibrosis, greatest in the subpleural location. Hypodense masses seen at the left hilum, in the region of the left atrial appendage. It is difficult to tell whether this is within the left atrial appendage or the left hilum. Hepatobiliary: There is extensive extrinsic mass effect on the liver from diffuse peritoneal metastases, which have progressed since prior  study. Multiple lesions within the inferior aspect right lobe liver are again identified, increased in size. Index lesion image 41 measures 2.4 cm, previously having measured 2.0 cm. The gallbladder is unremarkable. Pancreas: The pancreas cannot be adequately  visualized given the extensive mesenteric metastatic disease. Spleen: Extrinsic mass effect upon the spleen by enlarging peritoneal metastases. Adrenals/Urinary Tract: The adrenals are difficult to evaluate given the extensive metastatic disease. Kidneys enhance normally and symmetrically. Bladder is decompressed, with extrinsic mass effect from enlarging peritoneal metastases. Stomach/Bowel: Bowel is difficult to evaluate given the extensive and progressive peritoneal metastases. Minimal gas and stool can be seen throughout the colon. Partial visualization of fluid-filled loops of small bowel in the right quadrant, though the majority of the small bowel is effaced by the peritoneal metastases. Vascular/Lymphatic: Stable atherosclerosis of the aorta. Discrete adenopathy cannot be identified, as there are extensive masses throughout the mesentery consistent with progressive metastatic disease. Reproductive: Prostate is unremarkable. Other: Diffuse progressive omental caking consistent with peritoneal metastases. This results in extrinsic mass effect upon all the abdominal viscera, though most pronounced along the liver and throughout the bowel. No free fluid or free gas. Musculoskeletal: There are no acute or destructive bony lesions. Reconstructed images demonstrate no additional findings. IMPRESSION: 1. Marked progression of omental caking and peritoneal metastases since prior study. Complete effacement the bowel, with progressive extrinsic mass effect upon the abdominal viscera as above. 2. Progressive hepatic metastases. 3. Left hilar metastasis versus thrombus within the left atrial appendage. Grossly stable. 4.  Aortic Atherosclerosis (ICD10-I70.0). Electronically Signed   By: Randa Ngo M.D.   On: 12/19/2019 15:12   DG Chest Portable 1 View  Result Date: 12/13/2019 CLINICAL DATA:  Vomiting. EXAM: PORTABLE CHEST 1 VIEW COMPARISON:  May 06, 2009. FINDINGS: The heart size and mediastinal contours are  within normal limits. No pneumothorax or pleural effusion is noted. Left upper lobe airspace opacity is noted concerning for pneumonia. Minimal right basilar subsegmental atelectasis or infiltrate is noted. The visualized skeletal structures are unremarkable. IMPRESSION: Left upper lobe airspace opacity is noted concerning for pneumonia. Minimal right basilar subsegmental atelectasis or infiltrate is noted. Electronically Signed   By: Marijo Conception M.D.   On: 12/16/2019 13:31    Procedures .Critical Care Performed by: Sherwood Gambler, MD Authorized by: Sherwood Gambler, MD   Critical care provider statement:    Critical care time (minutes):  40   Critical care time was exclusive of:  Separately billable procedures and treating other patients   Critical care was necessary to treat or prevent imminent or life-threatening deterioration of the following conditions:  Endocrine crisis and dehydration   Critical care was time spent personally by me on the following activities:  Discussions with consultants, evaluation of patient's response to treatment, examination of patient, ordering and performing treatments and interventions, ordering and review of laboratory studies, ordering and review of radiographic studies, pulse oximetry, re-evaluation of patient's condition, obtaining history from patient or surrogate and review of old charts   (including critical care time)  Medications Ordered in ED Medications  sodium chloride (PF) 0.9 % injection (has no administration in time range)  dextrose 5 %-0.45 % sodium chloride infusion ( Intravenous New Bag/Given 12/05/2019 1607)  lidocaine (XYLOCAINE) 2 % jelly 1 application (has no administration in time range)  sodium chloride 0.9 % bolus 1,000 mL (0 mLs Intravenous Stopped 12/24/2019 1420)  HYDROmorphone (DILAUDID) injection 1 mg (1 mg Intravenous Given 12/22/2019 1249)  dextrose 50 % solution 25 mL (25  mLs Intravenous Given 01/01/2020 1249)  HYDROmorphone  (DILAUDID) injection 1 mg (1 mg Intravenous Given 12/28/2019 1513)  iohexol (OMNIPAQUE) 300 MG/ML solution 100 mL (100 mLs Intravenous Contrast Given 12/11/2019 1453)  lactated ringers bolus 1,000 mL (1,000 mLs Intravenous New Bag/Given 12/10/2019 1516)  dextrose 50 % solution 25 mL (25 mLs Intravenous Given 12/29/2019 1605)    ED Course  I have reviewed the triage vital signs and the nursing notes.  Pertinent labs & imaging results that were available during my care of the patient were reviewed by me and considered in my medical decision making (see chart for details).    MDM Rules/Calculators/A&P                          Patient CT shows severely worsening omental caking which seems to be causing poor transit in his bowels.  I think this is why is having the vomiting and constipation.  Pain is poorly controlled and he is also having hypoglycemia from lack of p.o. intake.  He will be placed on D5 half-normal drip after being given D50.  I discussed with Dr. Benay Spice of oncology who will have his doctor, Dr. Lorenso Courier, see him tonight or tomorrow.  Hospitalist to admit.  I did discuss with general surgery briefly but there is no surgical fix to this. Final Clinical Impression(s) / ED Diagnoses Final diagnoses:  Hypoglycemia  Omental metastasis ([place])    Rx / DC Orders ED Discharge Orders    None       Sherwood Gambler, MD 12/13/2019 1646

## 2019-12-25 NOTE — Consult Note (Signed)
Brief Oncology Consult Note  S: Mr. Aaron Davidson is a 49 year old male with medical history significant for metastatic clear cell carcinoma, likely of kidney origin who presents with marked decline in function status, poor PO intake, and constipation x 5 days.   Unfortunately attempts x 3 were made to connect with patient in the ED, but he was in the restroom x 1 hour and I was not able to have a conversation with him.  I was able to speak with his mother who provided details on his situation.  The patient's mother reports that he has not had a bowel movement for the last 5 days.  She reports that he frequently sits on the toilet for 1 hour at a time attempting to pass bowel movements.  All the results is bleeding from his hemorrhoids.  She also reports that he has had significantly poor p.o. intake over the last 5 days as well eating and drinking very little.  She notes that he is in terrible pain predominantly in his abdomen.  She notes he has not been reporting any fevers, chills, sweats, nausea, vomiting, or chest pain.  The patient reportedly established with hospice as was set up by the patient's cousin who is a Marine scientist at the [place].  After learning that he would have to stop chemotherapy while on hospice he requested discontinuation of their services.  O:  Vitals:   12/06/2019 1830 12/08/2019 1834  BP: (!) 115/97   Pulse: (!) 112   Resp:  16  Temp:  (!) 97.4 F (36.3 C)  SpO2: 98%    CMP Latest Ref Rng & Units 12/11/2019 12/20/2019 12/12/2019  Glucose 70 - 99 mg/dL 59(L) 62(L) 84  BUN 6 - 20 mg/dL 26(H) 27(H) 9  Creatinine 0.61 - 1.24 mg/dL 0.90 1.04 0.73  Sodium 135 - 145 mmol/L 129(L) 130(L) 129(L)  Potassium 3.5 - 5.1 mmol/L 5.3(H) 5.3(H) 4.6  Chloride 98 - 111 mmol/L 93(L) 93(L) 94(L)  CO2 22 - 32 mmol/L - 19(L) 24  Calcium 8.9 - 10.3 mg/dL - 7.8(L) 8.8(L)  Total Protein 6.5 - 8.1 g/dL - 5.7(L) 6.3(L)  Total Bilirubin 0.3 - 1.2 mg/dL - 1.7(H) 0.5  Alkaline Phos 38 - 126 U/L -  4,252(H) 4,060(H)  AST 15 - 41 U/L - 49(H) 37  ALT 0 - 44 U/L - 23 11   CBC Latest Ref Rng & Units 12/22/2019 12/13/2019 12/12/2019  WBC 4.0 - 10.5 K/uL - 11.1(H) 7.9  Hemoglobin 13.0 - 17.0 g/dL 16.0 15.8 16.0  Hematocrit 39 - 52 % 47.0 45.1 45.4  Platelets 150 - 400 K/uL - 138(L) 159    A/P:  Unfortunately I was not able to speak with [name] or examine him today as he was in the restroom during multiple attempts over 1 hour period trying to make contact with him.  I did have the opportunity to speak with his mother regarding treatment options moving forward.  Given the marked progression on his CT scan, his markedly declining appetite, is worsening pain, and extremely poor prognosis I would recommend proceeding with comfort based care only and reconnecting with hospice.  I do not think that chemotherapy will likely provide him much additional benefit at this time.  I am also concerned that his stomach issues will not improve as his abdomen is densely packed with tumor and the bleeding of his hemorrhoids and constipation will make further treatments incredibly difficult.  Overall I do not believe that chemotherapy will add  much to this patient's life and that his life expectancy is probably best measured in weeks.  I would strongly encourage palliative care consult and I will discuss this personally with the patient tomorrow.  #Metastatic Clear Cell Renal Cancer #Poor PO Intake/Failure to Thrive #Intractable Abdominal Pain #Severe Constipation/Enlarged External Hemorrhoids --unfortunately I was not able to connect with the patient today as he was in the bathroom for a prolonged period. I will speak with him tomorrow to discuss his situation. I was able to speak with his mother in the ED, who provided the background information.  --recommend palliative care consult. It is reasonable to transition to comfort based care and hospice. His condition may have deteriorated to the point he requires  inpatient hospice.  --I think chemotherapy/immunotherapy is unlikely to be of much benefit. His prognosis with widespread clear cell is extremely poor, with remaining life span likely best measured in weeks at best. --Oncology will continue to follow.   Ledell Peoples, MD Department of Hematology/Oncology [place] [facility] Phone: [phone] Pager: [phone] Email: [email or web address]

## 2019-12-25 NOTE — ED Notes (Signed)
Per oncologist, states patient has a history of cancer-metz to lungs and brain-complaining of constipation and bulging hemorrhoids

## 2019-12-25 NOTE — ED Notes (Signed)
Pt taken to bathroom, instructed to pull string and wait for assistance before getting up.

## 2019-12-25 NOTE — Telephone Encounter (Signed)
Called pt's mother per 9/23 sch smg - no answer. Left message for patient to call back to reschedule appt.

## 2019-12-25 NOTE — H&P (Signed)
.  History and Physical    Aaron Davidson QQV:[id number] DOB: June 26, 1970 DOA: 12/31/2019  PCP: Maximiano Coss, NP  Patient coming from: Home   Chief Complaint: Intractable abdominal/rectal pain  HPI: LEODAN BOLYARD is a 49 y.o. male with medical history significant of renal cell carcinoma w/ metastases. Presenting with ab pain, N/V, weakness. Mother notes that he has not been eating for the last week or so d/t nausea and pain. He has grown weaker as a result. He notes constipation and pain with hemorrhoids. Of note per patient/mother, multiple CT ab/pelvis examinations over last 6 weeks have shown RCC metastatic disease that is worsening. He denies any alleviating factors. His mother became concerned for his lack of eating and requested EMS take him to the ED. He was found to be hypoglycemic when they picked him up.    ED Course: EDP called [facility] for review. He was given D50 for hypoglycemia. CT ab/pelvis revealed worsening metastatic disease w/ extrinsic mass effect on adbominal viscera. Case was d/w surgery. Per EDP, no surgical option at this time. [facility] called for admission.   Review of Systems: Review of systems is otherwise negative for all not mentioned in HPI.    Past Medical History:  Diagnosis Date   Cancer [facility])     Past Surgical History:  Procedure Laterality Date   DENTAL SURGERY     HERNIA REPAIR N/A    [employer] 09/09/2019   INGUINAL HERNIA REPAIR Left 12/13/2015   Procedure: LAPAROSCOPIC LEFT  INGUINAL HERNIA;  Surgeon: Ralene Ok, MD;  Location: [place];  Service: General;  Laterality: Left;   INSERTION OF MESH Left 12/13/2015   Procedure: INSERTION OF MESH;  Surgeon: Ralene Ok, MD;  Location: [place];  Service: General;  Laterality: Left;   IR US GUIDE BX ASP/DRAIN  11/19/2019   KNEE SURGERY Right    PILONIDAL CYST EXCISION       reports that he has been smoking cigarettes. He has a 76.00 pack-year smoking history. He has never used smokeless  tobacco. He reports current alcohol use of about 1.0 standard drink of alcohol per week. He reports that he does not use drugs.  Allergies  Allergen Reactions   No Known Allergies     Family History  Problem Relation Age of Onset   Heart disease Father     Prior to Admission medications   Medication Sig Start Date End Date Taking? Authorizing Provider  acetaminophen (TYLENOL) 500 MG tablet Take 500 mg by mouth every 6 (six) hours as needed for moderate pain.   Yes [provider]  allopurinol (ZYLOPRIM) 300 MG tablet Take 1 tablet (300 mg total) by mouth daily. 12/12/19  Yes Orson Slick, MD  ALPRAZolam (XANAX) 0.25 MG tablet TAKE 1 TABLET(0.25 MG) BY MOUTH AT BEDTIME AS NEEDED FOR ANXIETY Patient taking differently: Take 0.25 mg by mouth at bedtime.  12/15/19  Yes Maximiano Coss, NP  dicyclomine (BENTYL) 10 MG capsule TAKE 1 CAPSULE(10 MG) BY MOUTH FOUR TIMES DAILY BEFORE MEALS AND AT BEDTIME Patient taking differently: Take 10 mg by mouth in the morning, at noon, in the evening, and at bedtime.  12/15/19  Yes Maximiano Coss, NP  diltiazem 2 % GEL Apply 1 application topically 4 (four) times daily as needed (After using bathroom).    Yes [provider]  gabapentin (NEURONTIN) 600 MG tablet Take 1 tablet (600 mg total) by mouth 2 (two) times daily. 11/25/19  Yes Maximiano Coss, NP  Bartholomew Boards  5 MG tablet Take 1 tablet by mouth every 12 hours Patient taking differently: Take 5 mg by mouth every 12 (twelve) hours.  12/11/19  Yes Orson Slick, MD  Lidocaine-Hydrocort, Perianal, 3-0.5 % CREA Place 1 application rectally 2 (two) times daily. Patient taking differently: Place 1 application rectally daily as needed (Hemorrhoids).  10/24/19  Yes Maximiano Coss, NP  ondansetron (ZOFRAN-ODT) 4 MG disintegrating tablet Take 1 tablet (4 mg total) by mouth 4 (four) times daily as needed. Patient taking differently: Take 4 mg by mouth 4 (four) times daily as needed for nausea or  vomiting.  11/25/19  Yes Maximiano Coss, NP  oxyCODONE (ROXICODONE) 15 MG immediate release tablet Take 1 tablet (15 mg total) by mouth every 4 (four) hours as needed for pain. 12/19/19  Yes Maximiano Coss, NP  Sennosides-Docusate Sodium (SENNA-S PO) Take 1 tablet by mouth in the morning and at bedtime.   Yes [provider]  ondansetron (ZOFRAN-ODT) 8 MG disintegrating tablet Take 1 tablet (8 mg total) by mouth every 8 (eight) hours as needed for nausea or vomiting. Patient not taking: Reported on 12/21/2019 11/25/19   Maximiano Coss, NP  prochlorperazine (COMPAZINE) 10 MG tablet Take 1 tablet (10 mg total) by mouth every 6 (six) hours as needed for nausea or vomiting. Patient not taking: Reported on 12/11/2019 12/02/19   Orson Slick, MD  sildenafil (VIAGRA) 25 MG tablet Take 2 tablets (50 mg total) by mouth daily as needed for erectile dysfunction. Patient not taking: Reported on 12/31/2019 09/19/19   Maximiano Coss, NP    Physical Exam: Vitals:   12/08/2019 1345 12/05/2019 1400 12/12/2019 1406 12/13/2019 1415  BP:  120/84 120/84   Pulse: (!) 106 (!) 108 (!) 107 (!) 114  Resp:   16   Temp:      TempSrc:      SpO2: 95% 96% 95% 97%  Weight:      Height:       General: 49 y.o. male resting in bed in pain Eyes: PERRL, normal sclera ENMT: Nares patent w/o discharge, orophaynx clear, dentition normal, ears w/o discharge/lesions/ulcers Cardiovascular: tachy, regular rhythm, +S1, S2, no m/g/r, equal pulses throughout Respiratory: CTABL, no w/r/r, normal WOB GI: BS hypoactive, significantly distended, global TTP, significant guarding MSK: No c/c; BLE edema (chronic per pt/family) Skin: No rashes, bruises, ulcerations noted Neuro: A&O x 3, no focal deficits Psyc: Appropriate interaction and affect, in distress but cooperative   Labs on Admission: I have personally reviewed following labs and imaging studies  CBC: Recent Labs  Lab 12/10/2019 1247 12/18/2019 1304  WBC 11.1*  --     NEUTROABS 9.2*  --   HGB 15.8 16.0  HCT 45.1 47.0  MCV 94.2  --   PLT 138*  --    Basic Metabolic Panel: Recent Labs  Lab 12/15/2019 1247 12/18/2019 1304  NA 130* 129*  K 5.3* 5.3*  CL 93* 93*  CO2 19*  --   GLUCOSE 62* 59*  BUN 27* 26*  CREATININE 1.04 0.90  CALCIUM 7.8*  --    GFR: Estimated Creatinine Clearance: 103.4 mL/min (by C-G formula based on SCr of 0.9 mg/dL). Liver Function Tests: Recent Labs  Lab 12/05/2019 1247  AST 49*  ALT 23  ALKPHOS 4,252*  BILITOT 1.7*  PROT 5.7*  ALBUMIN 2.7*   Recent Labs  Lab 12/28/2019 1247  LIPASE 20   No results for input(s): AMMONIA in the last 168 hours. Coagulation Profile: No results for  input(s): INR, PROTIME in the last 168 hours. Cardiac Enzymes: No results for input(s): CKTOTAL, CKMB, CKMBINDEX, TROPONINI in the last 168 hours. BNP (last 3 results) No results for input(s): PROBNP in the last 8760 hours. HbA1C: No results for input(s): HGBA1C in the last 72 hours. CBG: Recent Labs  Lab 12/15/2019 1201 12/30/2019 1225 12/13/2019 1311 12/05/2019 1400 12/17/2019 1550  GLUCAP 66* 63* 82 72 61*   Lipid Profile: No results for input(s): CHOL, HDL, LDLCALC, TRIG, CHOLHDL, LDLDIRECT in the last 72 hours. Thyroid Function Tests: No results for input(s): TSH, T4TOTAL, FREET4, T3FREE, THYROIDAB in the last 72 hours. Anemia Panel: No results for input(s): VITAMINB12, FOLATE, FERRITIN, TIBC, IRON, RETICCTPCT in the last 72 hours. Urine analysis:    Component Value Date/Time   COLORURINE YELLOW 12/14/2019 1422   APPEARANCEUR CLEAR 12/18/2019 1422   LABSPEC 1.017 12/09/2019 1422   PHURINE 6.0 12/15/2019 1422   GLUCOSEU NEGATIVE 12/24/2019 1422   HGBUR NEGATIVE 12/07/2019 1422   BILIRUBINUR NEGATIVE 12/21/2019 1422   KETONESUR 20 (A) 12/23/2019 1422   PROTEINUR NEGATIVE 12/12/2019 1422   UROBILINOGEN 0.2 05/06/2009 1157   NITRITE NEGATIVE 12/19/2019 1422   LEUKOCYTESUR NEGATIVE 12/30/2019 1422    Radiological Exams on  Admission: CT ABDOMEN PELVIS W CONTRAST  Result Date: 12/26/2019 CLINICAL DATA:  Stage IV renal cell carcinoma metastatic to lungs and liver, constipation EXAM: CT ABDOMEN AND PELVIS WITH CONTRAST TECHNIQUE: Multidetector CT imaging of the abdomen and pelvis was performed using the standard protocol following bolus administration of intravenous contrast. CONTRAST:  144mL OMNIPAQUE IOHEXOL 300 MG/ML  SOLN COMPARISON:  11/07/2019. FINDINGS: Lower chest: Bibasilar scarring and fibrosis, greatest in the subpleural location. Hypodense masses seen at the left hilum, in the region of the left atrial appendage. It is difficult to tell whether this is within the left atrial appendage or the left hilum. Hepatobiliary: There is extensive extrinsic mass effect on the liver from diffuse peritoneal metastases, which have progressed since prior study. Multiple lesions within the inferior aspect right lobe liver are again identified, increased in size. Index lesion image 41 measures 2.4 cm, previously having measured 2.0 cm. The gallbladder is unremarkable. Pancreas: The pancreas cannot be adequately visualized given the extensive mesenteric metastatic disease. Spleen: Extrinsic mass effect upon the spleen by enlarging peritoneal metastases. Adrenals/Urinary Tract: The adrenals are difficult to evaluate given the extensive metastatic disease. Kidneys enhance normally and symmetrically. Bladder is decompressed, with extrinsic mass effect from enlarging peritoneal metastases. Stomach/Bowel: Bowel is difficult to evaluate given the extensive and progressive peritoneal metastases. Minimal gas and stool can be seen throughout the colon. Partial visualization of fluid-filled loops of small bowel in the right quadrant, though the majority of the small bowel is effaced by the peritoneal metastases. Vascular/Lymphatic: Stable atherosclerosis of the aorta. Discrete adenopathy cannot be identified, as there are extensive masses throughout  the mesentery consistent with progressive metastatic disease. Reproductive: Prostate is unremarkable. Other: Diffuse progressive omental caking consistent with peritoneal metastases. This results in extrinsic mass effect upon all the abdominal viscera, though most pronounced along the liver and throughout the bowel. No free fluid or free gas. Musculoskeletal: There are no acute or destructive bony lesions. Reconstructed images demonstrate no additional findings. IMPRESSION: 1. Marked progression of omental caking and peritoneal metastases since prior study. Complete effacement the bowel, with progressive extrinsic mass effect upon the abdominal viscera as above. 2. Progressive hepatic metastases. 3. Left hilar metastasis versus thrombus within the left atrial appendage. Grossly stable. 4.  Aortic Atherosclerosis (  ICD10-I70.0). Electronically Signed   By: Randa Ngo M.D.   On: 12/30/2019 15:12   DG Chest Portable 1 View  Result Date: 12/03/2019 CLINICAL DATA:  Vomiting. EXAM: PORTABLE CHEST 1 VIEW COMPARISON:  May 06, 2009. FINDINGS: The heart size and mediastinal contours are within normal limits. No pneumothorax or pleural effusion is noted. Left upper lobe airspace opacity is noted concerning for pneumonia. Minimal right basilar subsegmental atelectasis or infiltrate is noted. The visualized skeletal structures are unremarkable. IMPRESSION: Left upper lobe airspace opacity is noted concerning for pneumonia. Minimal right basilar subsegmental atelectasis or infiltrate is noted. Electronically Signed   By: Marijo Conception M.D.   On: 12/18/2019 13:31    EKG: Independently reviewed. Sinus tach  Assessment/Plan Intractable abdominal/rectal pain Metastatic RCC Poor PO intake Hypoglycemia     - admit to inpt med-tele     - fluids (D5 NS)     - [facility] has been consulted by EDP, appreciate assistance     - consult palliative care for pain management assistance     - will start dilaudid, ms contin      - Clear liquid diet     - given Hx, very poor prognosis  Hyponatremia     - fluids, follow  Hyperkalemia     - fluids follow  Elevated alkaline phosphatase     - check ggt, likely secondary to RCC  Elevated LFTs     - known mets to liver; follow  DVT prophylaxis: SCDs  Code Status: FULL  Family Communication: With mother at bedside   Consults called: GI, PC  Admission status: Inpatient for pain controll.   Status is: Inpatient  Remains inpatient appropriate because:Ongoing active pain requiring inpatient pain management   Dispo: The patient is from: Home              Anticipated d/c is to: Home              Anticipated d/c date is: 3 days              Patient currently is not medically stable to d/c.  Jonnie Finner DO [facility]  If 7PM-7AM, please contact night-coverage [email or web address]  12/22/2019, 5:54 PM

## 2019-12-26 ENCOUNTER — Other Ambulatory Visit: Payer: Self-pay | Admitting: Registered Nurse

## 2019-12-26 ENCOUNTER — Encounter (HOSPITAL_COMMUNITY): Payer: Self-pay | Admitting: Internal Medicine

## 2019-12-26 ENCOUNTER — Ambulatory Visit: Payer: 59 | Admitting: Hematology and Oncology

## 2019-12-26 DIAGNOSIS — Z7189 Other specified counseling: Secondary | ICD-10-CM

## 2019-12-26 DIAGNOSIS — C786 Secondary malignant neoplasm of retroperitoneum and peritoneum: Secondary | ICD-10-CM | POA: Diagnosis not present

## 2019-12-26 DIAGNOSIS — Z515 Encounter for palliative care: Secondary | ICD-10-CM

## 2019-12-26 DIAGNOSIS — R52 Pain, unspecified: Secondary | ICD-10-CM | POA: Diagnosis not present

## 2019-12-26 DIAGNOSIS — Z66 Do not resuscitate: Secondary | ICD-10-CM | POA: Diagnosis not present

## 2019-12-26 DIAGNOSIS — K602 Anal fissure, unspecified: Secondary | ICD-10-CM

## 2019-12-26 DIAGNOSIS — E162 Hypoglycemia, unspecified: Secondary | ICD-10-CM | POA: Diagnosis not present

## 2019-12-26 DIAGNOSIS — R109 Unspecified abdominal pain: Secondary | ICD-10-CM | POA: Diagnosis not present

## 2019-12-26 LAB — COMPREHENSIVE METABOLIC PANEL
ALT: 23 U/L (ref 0–44)
AST: 51 U/L — ABNORMAL HIGH (ref 15–41)
Albumin: 2.5 g/dL — ABNORMAL LOW (ref 3.5–5.0)
Alkaline Phosphatase: 3929 U/L — ABNORMAL HIGH (ref 38–126)
Anion gap: 15 (ref 5–15)
BUN: 20 mg/dL (ref 6–20)
CO2: 21 mmol/L — ABNORMAL LOW (ref 22–32)
Calcium: 7.7 mg/dL — ABNORMAL LOW (ref 8.9–10.3)
Chloride: 96 mmol/L — ABNORMAL LOW (ref 98–111)
Creatinine, Ser: 0.87 mg/dL (ref 0.61–1.24)
GFR calc Af Amer: >60 mL/min (ref >60)
GFR calc non Af Amer: >60 mL/min (ref >60)
Glucose, Bld: 68 mg/dL — ABNORMAL LOW (ref 70–99)
Potassium: 5 mmol/L (ref 3.5–5.1)
Sodium: 132 mmol/L — ABNORMAL LOW (ref 135–145)
Total Bilirubin: 1.1 mg/dL (ref 0.3–1.2)
Total Protein: 5.2 g/dL — ABNORMAL LOW (ref 6.5–8.1)

## 2019-12-26 LAB — CBC
HCT: 41.7 % (ref 39.0–52.0)
Hemoglobin: 14.6 g/dL (ref 13.0–17.0)
MCH: 33.6 pg (ref 26.0–34.0)
MCHC: 35 g/dL (ref 30.0–36.0)
MCV: 95.9 fL (ref 80.0–100.0)
Platelets: 109 10*3/uL — ABNORMAL LOW (ref 150–400)
RBC: 4.35 MIL/uL (ref 4.22–5.81)
RDW: 14.3 % (ref 11.5–15.5)
WBC: 10.6 10*3/uL — ABNORMAL HIGH (ref 4.0–10.5)
nRBC: 0.2 % (ref 0.0–0.2)

## 2019-12-26 MED ORDER — HYDROMORPHONE BOLUS VIA INFUSION
1.0000 mg | INTRAVENOUS | Status: DC | PRN
  Administered 2019-12-26 – 2019-12-27 (×3): 1 mg via INTRAVENOUS
  Filled 2019-12-26: qty 1

## 2019-12-26 MED ORDER — LACTULOSE 10 GM/15ML PO SOLN
20.0000 g | ORAL | Status: DC | PRN
  Administered 2019-12-26: 20 g via ORAL
  Filled 2019-12-26: qty 30

## 2019-12-26 MED ORDER — SODIUM CHLORIDE 0.9 % IV BOLUS
500.0000 mL | Freq: Once | INTRAVENOUS | Status: AC
  Administered 2019-12-26: 500 mL via INTRAVENOUS

## 2019-12-26 MED ORDER — SODIUM CHLORIDE 0.9 % IV SOLN
0.5000 mg/h | INTRAVENOUS | Status: DC
  Administered 2019-12-26 – 2019-12-27 (×2): 0.5 mg/h via INTRAVENOUS
  Filled 2019-12-26 (×2): qty 2.5

## 2019-12-26 MED ORDER — HYDROMORPHONE BOLUS VIA INFUSION
1.0000 mg | INTRAVENOUS | Status: DC | PRN
  Filled 2019-12-26: qty 1

## 2019-12-26 NOTE — Progress Notes (Signed)
  Pt with 12-beats run of vtach. See flowsheet for vitals.  Pt with severe pain and had prior received IV dilaudid. APP [facility] made aware of vtach. No new orders given. Will cont to monitor pt.

## 2019-12-26 NOTE — Consult Note (Signed)
[place]  [phone] Fax:([phone] [place]  Reason for Consult: Metastatic clear-cell carcinoma, likely a kidney origin  Hematological/Oncological History #Metastatic Clear Cell Carcinoma, Likely ofKidneyOrigin 1) 08/08/2019: presented to PCP with abdominal cramping.  2) 11/07/2019: CT abdomen pelvis performed which revealed extensive omental caking encasing nearly all of the viscera in the abdomen and pelvis. It demonstrates mass effect on the liver and spleen without any evidence of bowel obstruction 3) 11/19/2019: biopsy performed of omental mass, findings shows carcinoma with clear cell featuresconsistent withprimary renal cell carcinoma 4) 11/27/2019: establish care with Dr. Lorenso Courier 5) 12/12/2019: Cycle 1 Day 1 of Pembrolizumab/Axitinib  HPI: Mr. Whorley is a 49 year old male with a past medical history significant for renal cell carcinoma with metastases.  He presented to the emergency room with intractable abdominal pain and rectal pain.  He was found to be hypoglycemic upon presentation to the emergency room.  A CT of the abdomen pelvis with contrast was performed which showed marked progression of omental caking and peritoneal metastases since prior CT from 11/07/2019.  He has complete effacement of the bowel with progressive extrinsic mass-effect upon the abdominal viscera, progressive hepatic metastases, left hilar metastasis versus thrombus within the left atrial appendage.  When seen today, the patient continues to have significant abdominal pain.  His biggest issue is constipation and hemorrhoids.  He has not moved his bowels in about 6 days.  He has had some intermittent nausea and vomiting.  He has a poor appetite.  He is not complaining of any chest pain or shortness of breath today.  Medical oncology was asked see the patient to make recommendations regarding his metastatic clear cell carcinoma.   Past Medical History:   Diagnosis Date  . Cancer [facility])   :  Past Surgical History:  Procedure Laterality Date  . DENTAL SURGERY    . HERNIA REPAIR N/A    [employer]ia 09/09/2019  . INGUINAL HERNIA REPAIR Left 12/13/2015   Procedure: LAPAROSCOPIC LEFT  INGUINAL HERNIA;  Surgeon: Ralene Ok, MD;  Location: [place];  Service: General;  Laterality: Left;  . INSERTION OF MESH Left 12/13/2015   Procedure: INSERTION OF MESH;  Surgeon: Ralene Ok, MD;  Location: [place];  Service: General;  Laterality: Left;  . IR US GUIDE BX ASP/DRAIN  11/19/2019  . KNEE SURGERY Right   . PILONIDAL CYST EXCISION    :  Current Facility-Administered Medications  Medication Dose Route Frequency Provider Last Rate Last Admin  . acetaminophen (TYLENOL) tablet 500 mg  500 mg Oral Q6H PRN Marylyn Ishihara, Tyrone A, DO      . ALPRAZolam Duanne Moron) tablet 0.25 mg  0.25 mg Oral QHS PRN Marylyn Ishihara, Tyrone A, DO   0.25 mg at 12/03/2019 2136  . bisacodyl (DULCOLAX) EC tablet 5 mg  5 mg Oral Daily PRN Marylyn Ishihara, Tyrone A, DO      . dextrose 5 %-0.9 % sodium chloride infusion   Intravenous Continuous Kyle, Tyrone A, DO 100 mL/hr at 12/26/19 1005 New Bag at 12/26/19 1005  . dicyclomine (BENTYL) capsule 10 mg  10 mg Oral TID AC & HS Kyle, Tyrone A, DO   10 mg at 12/26/19 1202  . diltiazem 2 % gel 1 application  1 application Topical QID PRN Marylyn Ishihara, Tyrone A, DO      . docusate sodium (COLACE) capsule 100 mg  100 mg Oral BID Marylyn Ishihara, Tyrone A, DO   100 mg at 12/26/19 1004  . gabapentin (NEURONTIN) capsule  600 mg  600 mg Oral BID Marylyn Ishihara, Tyrone A, DO   600 mg at 12/26/19 1003  . hydrocortisone-pramoxine (ANALPRAM-HC) 2.5-1 % rectal cream 1 application  1 application Rectal Daily PRN Marylyn Ishihara, Tyrone A, DO      . HYDROmorphone (DILAUDID) 25 mg in sodium chloride 0.9 % 50 mL (0.5 mg/mL) infusion  0.5-2 mg/hr Intravenous Continuous Vinie Sill C, NP      . HYDROmorphone (DILAUDID) bolus via infusion 1 mg  1 mg Intravenous Q15 min PRN Pershing Proud, NP      . HYDROmorphone (DILAUDID)  injection 1 mg  1 mg Intravenous Q2H PRN Marylyn Ishihara, Tyrone A, DO   1 mg at 12/26/19 1153  . lactulose (CHRONULAC) 10 GM/15ML solution 20 g  20 g Oral Q4H PRN Maryanna Shape, NP   20 g at 12/26/19 1202  . ondansetron (ZOFRAN) tablet 4 mg  4 mg Oral Q6H PRN Marylyn Ishihara, Tyrone A, DO       Or  . ondansetron (ZOFRAN) injection 4 mg  4 mg Intravenous Q6H PRN Marylyn Ishihara, Tyrone A, DO   4 mg at 12/26/19 1202  . senna-docusate (Senokot-S) tablet 1 tablet  1 tablet Oral BID Marylyn Ishihara, Tyrone A, DO   1 tablet at 12/26/19 1004     Allergies  Allergen Reactions  . No Known Allergies   :  Family History  Problem Relation Age of Onset  . Heart disease Father   :  Social History   Socioeconomic History  . Marital status: Divorced    Spouse name: Not on file  . Number of children: Not on file  . Years of education: Not on file  . Highest education level: Not on file  Occupational History  . Not on file  Tobacco Use  . Smoking status: Current Some Day Smoker    Packs/day: 2.00    Years: 38.00    Pack years: 76.00    Types: Cigarettes  . Smokeless tobacco: Never Used  Substance and Sexual Activity  . Alcohol use: Yes    Alcohol/week: 1.0 standard drink    Types: 1 Standard drinks or equivalent per week    Comment: rare  . Drug use: No  . Sexual activity: Not on file  Other Topics Concern  . Not on file  Social History Narrative  . Not on file   Social Determinants of Health   Financial Resource Strain:   . Difficulty of Paying Living Expenses: Not on file  Food Insecurity:   . Worried About Charity fundraiser in the Last Year: Not on file  . Ran Out of Food in the Last Year: Not on file  Transportation Needs:   . Lack of Transportation (Medical): Not on file  . Lack of Transportation (Non-Medical): Not on file  Physical Activity:   . Days of Exercise per Week: Not on file  . Minutes of Exercise per Session: Not on file  Stress:   . Feeling of Stress : Not on file  Social Connections:   .  Frequency of Communication with Friends and Family: Not on file  . Frequency of Social Gatherings with Friends and Family: Not on file  . Attends Religious Services: Not on file  . Active Member of Clubs or Organizations: Not on file  . Attends Archivist Meetings: Not on file  . Marital Status: Not on file  Intimate Partner Violence:   . Fear of Current or Ex-Partner: Not on file  . Emotionally Abused:  Not on file  . Physically Abused: Not on file  . Sexually Abused: Not on file  :  Review of Systems: A comprehensive 14 point review of systems was negative except as noted in the HPI.  Exam: Patient Vitals for the past 24 hrs:  BP Temp Temp src Pulse Resp SpO2  12/26/19 0500 102/84 98 F (36.7 C) Oral (!) 109 -- 96 %  12/26/19 0108 (!) 107/91 97.7 F (36.5 C) Oral (!) 112 -- 97 %  12/17/2019 2300 114/82 98 F (36.7 C) Oral (!) 112 -- 100 %  12/06/2019 2028 112/76 98 F (36.7 C) -- (!) 112 17 100 %  12/29/2019 1834 -- (!) 97.4 F (36.3 C) Oral -- 16 --  12/12/2019 1830 (!) 115/97 -- -- (!) 112 -- 98 %  12/12/2019 1815 -- -- -- (!) 113 -- 96 %  12/26/2019 1800 (!) 112/100 -- -- (!) 110 -- 97 %  12/12/2019 1745 112/72 -- -- (!) 116 -- 97 %  12/12/2019 1600 -- -- -- (!) 109 -- 97 %  12/31/2019 1545 -- -- -- (!) 111 -- 98 %  12/20/2019 1530 -- -- -- (!) 110 -- 98 %  12/08/2019 1515 -- -- -- (!) 111 -- 99 %  12/30/2019 1500 -- -- -- -- -- 97 %  12/06/2019 1445 -- -- -- (!) 108 -- 97 %  12/14/2019 1430 98/75 -- -- (!) 101 -- 97 %  12/09/2019 1415 -- -- -- (!) 114 -- 97 %  12/19/2019 1406 120/84 -- -- (!) 107 16 95 %  12/26/2019 1400 120/84 -- -- (!) 108 -- 96 %  12/26/2019 1345 -- -- -- (!) 106 -- 95 %    General: Chronically ill-appearing male, appears uncomfortable and does not sit down due to pain Eyes:  no scleral icterus.   ENT:  There were no oropharyngeal lesions.   Respiratory: lungs were clear bilaterally without wheezing or crackles.   Cardiovascular: Tachycardic GI: Abdomen distended with  tenderness   Neuro exam was nonfocal. Patient was alert and oriented.  Attention was good.   Language was appropriate.  Mood was normal without depression.  Speech was not pressured.  Thought content was not tangential.     Lab Results  Component Value Date   WBC 10.6 (H) 12/26/2019   HGB 14.6 12/26/2019   HCT 41.7 12/26/2019   PLT 109 (L) 12/26/2019   GLUCOSE 68 (L) 12/26/2019   CHOL 161 08/08/2019   TRIG 80 08/08/2019   HDL 77 08/08/2019   LDLCALC 69 08/08/2019   ALT 23 12/26/2019   AST 51 (H) 12/26/2019   NA 132 (L) 12/26/2019   K 5.0 12/26/2019   CL 96 (L) 12/26/2019   CREATININE 0.87 12/26/2019   BUN 20 12/26/2019   CO2 21 (L) 12/26/2019    CT Chest Wo Contrast  Result Date: 12/11/2019 CLINICAL DATA:  Dyspnea, chronic of unclear etiology. EXAM: CT CHEST WITHOUT CONTRAST TECHNIQUE: Multidetector CT imaging of the chest was performed following the standard protocol without IV contrast. COMPARISON:  CT abdomen and pelvis November 07, 2019 FINDINGS: Cardiovascular: Heart size is normal without pericardial effusion. Minimal calcified atheromatous plaque in the thoracic aorta. No aneurysmal dilation. Central pulmonary vasculature of normal caliber. Limited assessment of heart and cardiovascular structures in general without intravenous contrast. Mediastinum/Nodes: Thoracic inlet structures are normal. No axillary lymphadenopathy. Bulky AP window and LEFT paratracheal adenopathy as well as LEFT hilar adenopathy. As a conglomerate of lymph nodes in the LEFT  paratracheal and AP window regions a 4.4 x 2.8 cm nodal mass is noted. LEFT hilar and suprahilar nodal disease (image 67, series 8) 3.5 x 3.3 cm. Pre-vascular lymph nodes (image 60, series 2) 1.5 cm short axis lymph node. Enlarged lymph nodes track up through the anterior mediastinum with the retrosternal pre-vascular node measuring 1 cm on image 48 of series 2. Scattered small lymph nodes in the RIGHT mediastinum. No gross RIGHT hilar  adenopathy. Esophagus grossly normal. Lungs/Pleura: Signs of extensive subpleural reticulation and septal thickening on a background of both paraseptal and centrilobular emphysema. Basilar atelectasis on the RIGHT overlying the RIGHT hemidiaphragm which is elevated in the setting of diffuse abdominal tumor. Lobular LEFT upper lobe pulmonary mass measures 4 x 2.1 cm (image 45, series 8) this is found along the pleural surface in the LEFT upper lobe antral laterally with associated pleural thickening. There is also a discrete nodule seen inferiorly along the pleura (image 66, series 8) 9 mm. Subtle pleural nodularity along the major fissure in the LEFT chest. No pericardial effusion. No pleural effusion. Airways are patent. Wile patent LEFT upper lobe airways are narrowed at the LEFT hilum due to LEFT hilar mass. Lung bases without signs of nodules or masses. Upper Abdomen: Incidental imaging of upper abdominal contents again shows diffuse peritoneal involvement and hepatic parenchymal involvement. Areas not well evaluated given lack of intravenous contrast, grossly unchanged compared to August 6th with extensive subdiaphragmatic, hepatic and peritoneal disease. Musculoskeletal: No acute musculoskeletal process. No destructive bone finding. There is mixed lucent and sclerotic change in the RIGHT glenoid in the absence of additional signs of degenerative process. This tracks into the body of the RIGHT scapula measuring approximately 2.8 x 2.5 cm. IMPRESSION: 1. LEFT upper lobe pulmonary mass with associated hilar and mediastinal adenopathy. Findings would be more indicative of a primary pulmonary neoplasm given the location in the lack of any basilar nodules that would better fit the characteristics of metastasis. Biopsy may be helpful to determine whether this is the source of clear cell tumor in the abdomen or represents a second primary. 2. Diffuse interstitial thickening and subtle nodularity of pleural surfaces  with a discrete pleural nodule seen distant from the dominant nodule in the LEFT upper lobe. Constellation of findings raising the question of lymphangitic carcinomatosis and additional sites of upper lobe involvement. Background interstitial lung disease is also possible. 3. Abdominal findings better displayed on recent abdominal and pelvic CT. Of note there is no discrete renal mass seen on the prior CT. Would consider other potential sites for clear cell neoplasm including the lung as differential possibilities that would explain recent biopsy results. Hepatic tumors may also display clear cell characteristics. 4. Mixed lucent and sclerotic process in the RIGHT glenoid in the absence of significant degenerative change raising the question of metastatic lesion to this area in the RIGHT scapula. MRI may be helpful as warranted for further evaluation. 5. Emphysema and aortic atherosclerosis. These results will be called to the ordering clinician or representative by the Radiologist Assistant, and communication documented in the PACS or [employer]. Aortic Atherosclerosis (ICD10-I70.0) and Emphysema (ICD10-J43.9). Electronically Signed   By: Zetta Bills M.D.   On: 12/11/2019 08:28   MR Brain W Wo Contrast  Result Date: 12/10/2019 CLINICAL DATA:  Initial evaluation for urologic cancer, staging. EXAM: MRI HEAD WITHOUT AND WITH CONTRAST TECHNIQUE: Multiplanar, multiecho pulse sequences of the brain and surrounding structures were obtained without and with intravenous contrast. CONTRAST:  7.9mL GADAVIST  GADOBUTROL 1 MMOL/ML IV SOLN COMPARISON:  None available. FINDINGS: Brain: Cerebral volume within normal limits for age. Mild scattered T2/FLAIR hyperintensity noted within the periventricular and deep white matter both cerebral hemispheres, nonspecific, but most like related chronic microvascular ischemic disease, mild for age. No evidence for acute infarct. Gray-white matter differentiation maintained. No  encephalomalacia to suggest chronic cortical infarction. No evidence for acute or chronic intracranial hemorrhage. 11 mm focus of enhancement seen involving the cortex of the parasagittal right parietal lobe (series 15, image 102). Additional subtle 6 mm focus of patchy enhancement seen involving the right cerebellar vermis (series 15, image 50). Findings suspicious for possible metastatic disease. No associated mass effect or hemorrhage. No other mass lesion, mass effect, or midline shift. No hydrocephalus or extra-axial fluid collection. Pituitary gland suprasellar region within normal limits. Midline structures intact. Vascular: Major intracranial vascular flow voids are maintained. Skull and upper cervical spine: Craniocervical junction within normal limits. Bone marrow signal intensity normal. 9 mm soft tissue nodule present at the high right parietal scalp (series 11, image 44), indeterminate. Sinuses/Orbits: Globes and orbital soft tissues demonstrate no acute finding. Paranasal sinuses are clear. No significant mastoid effusion. Inner ear structures grossly normal. Other: None. IMPRESSION: 1. Two small foci of patchy enhancement involving the cortical gray matter of the right parietal lobe and right cerebellar vermis as above, suspicious for metastatic disease. No associated edema or mass effect. 2. 9 mm soft tissue nodule at the right parietal scalp, indeterminate. Metastatic implant not excluded. Correlation with physical exam recommended. 3. Underlying mild chronic microvascular ischemic disease. Electronically Signed   By: Jeannine Boga M.D.   On: 12/10/2019 03:04   CT ABDOMEN PELVIS W CONTRAST  Result Date: 12/26/2019 CLINICAL DATA:  Stage IV renal cell carcinoma metastatic to lungs and liver, constipation EXAM: CT ABDOMEN AND PELVIS WITH CONTRAST TECHNIQUE: Multidetector CT imaging of the abdomen and pelvis was performed using the standard protocol following bolus administration of  intravenous contrast. CONTRAST:  159mL OMNIPAQUE IOHEXOL 300 MG/ML  SOLN COMPARISON:  11/07/2019. FINDINGS: Lower chest: Bibasilar scarring and fibrosis, greatest in the subpleural location. Hypodense masses seen at the left hilum, in the region of the left atrial appendage. It is difficult to tell whether this is within the left atrial appendage or the left hilum. Hepatobiliary: There is extensive extrinsic mass effect on the liver from diffuse peritoneal metastases, which have progressed since prior study. Multiple lesions within the inferior aspect right lobe liver are again identified, increased in size. Index lesion image 41 measures 2.4 cm, previously having measured 2.0 cm. The gallbladder is unremarkable. Pancreas: The pancreas cannot be adequately visualized given the extensive mesenteric metastatic disease. Spleen: Extrinsic mass effect upon the spleen by enlarging peritoneal metastases. Adrenals/Urinary Tract: The adrenals are difficult to evaluate given the extensive metastatic disease. Kidneys enhance normally and symmetrically. Bladder is decompressed, with extrinsic mass effect from enlarging peritoneal metastases. Stomach/Bowel: Bowel is difficult to evaluate given the extensive and progressive peritoneal metastases. Minimal gas and stool can be seen throughout the colon. Partial visualization of fluid-filled loops of small bowel in the right quadrant, though the majority of the small bowel is effaced by the peritoneal metastases. Vascular/Lymphatic: Stable atherosclerosis of the aorta. Discrete adenopathy cannot be identified, as there are extensive masses throughout the mesentery consistent with progressive metastatic disease. Reproductive: Prostate is unremarkable. Other: Diffuse progressive omental caking consistent with peritoneal metastases. This results in extrinsic mass effect upon all the abdominal viscera, though most pronounced along the  liver and throughout the bowel. No free fluid or  free gas. Musculoskeletal: There are no acute or destructive bony lesions. Reconstructed images demonstrate no additional findings. IMPRESSION: 1. Marked progression of omental caking and peritoneal metastases since prior study. Complete effacement the bowel, with progressive extrinsic mass effect upon the abdominal viscera as above. 2. Progressive hepatic metastases. 3. Left hilar metastasis versus thrombus within the left atrial appendage. Grossly stable. 4.  Aortic Atherosclerosis (ICD10-I70.0). Electronically Signed   By: Randa Ngo M.D.   On: 12/13/2019 15:12   DG Chest Portable 1 View  Result Date: 12/11/2019 CLINICAL DATA:  Vomiting. EXAM: PORTABLE CHEST 1 VIEW COMPARISON:  May 06, 2009. FINDINGS: The heart size and mediastinal contours are within normal limits. No pneumothorax or pleural effusion is noted. Left upper lobe airspace opacity is noted concerning for pneumonia. Minimal right basilar subsegmental atelectasis or infiltrate is noted. The visualized skeletal structures are unremarkable. IMPRESSION: Left upper lobe airspace opacity is noted concerning for pneumonia. Minimal right basilar subsegmental atelectasis or infiltrate is noted. Electronically Signed   By: Marijo Conception M.D.   On: 12/24/2019 13:31     CT Chest Wo Contrast  Result Date: 12/11/2019 CLINICAL DATA:  Dyspnea, chronic of unclear etiology. EXAM: CT CHEST WITHOUT CONTRAST TECHNIQUE: Multidetector CT imaging of the chest was performed following the standard protocol without IV contrast. COMPARISON:  CT abdomen and pelvis November 07, 2019 FINDINGS: Cardiovascular: Heart size is normal without pericardial effusion. Minimal calcified atheromatous plaque in the thoracic aorta. No aneurysmal dilation. Central pulmonary vasculature of normal caliber. Limited assessment of heart and cardiovascular structures in general without intravenous contrast. Mediastinum/Nodes: Thoracic inlet structures are normal. No axillary  lymphadenopathy. Bulky AP window and LEFT paratracheal adenopathy as well as LEFT hilar adenopathy. As a conglomerate of lymph nodes in the LEFT paratracheal and AP window regions a 4.4 x 2.8 cm nodal mass is noted. LEFT hilar and suprahilar nodal disease (image 67, series 8) 3.5 x 3.3 cm. Pre-vascular lymph nodes (image 60, series 2) 1.5 cm short axis lymph node. Enlarged lymph nodes track up through the anterior mediastinum with the retrosternal pre-vascular node measuring 1 cm on image 48 of series 2. Scattered small lymph nodes in the RIGHT mediastinum. No gross RIGHT hilar adenopathy. Esophagus grossly normal. Lungs/Pleura: Signs of extensive subpleural reticulation and septal thickening on a background of both paraseptal and centrilobular emphysema. Basilar atelectasis on the RIGHT overlying the RIGHT hemidiaphragm which is elevated in the setting of diffuse abdominal tumor. Lobular LEFT upper lobe pulmonary mass measures 4 x 2.1 cm (image 45, series 8) this is found along the pleural surface in the LEFT upper lobe antral laterally with associated pleural thickening. There is also a discrete nodule seen inferiorly along the pleura (image 66, series 8) 9 mm. Subtle pleural nodularity along the major fissure in the LEFT chest. No pericardial effusion. No pleural effusion. Airways are patent. Wile patent LEFT upper lobe airways are narrowed at the LEFT hilum due to LEFT hilar mass. Lung bases without signs of nodules or masses. Upper Abdomen: Incidental imaging of upper abdominal contents again shows diffuse peritoneal involvement and hepatic parenchymal involvement. Areas not well evaluated given lack of intravenous contrast, grossly unchanged compared to August 6th with extensive subdiaphragmatic, hepatic and peritoneal disease. Musculoskeletal: No acute musculoskeletal process. No destructive bone finding. There is mixed lucent and sclerotic change in the RIGHT glenoid in the absence of additional signs of  degenerative process. This tracks into the body  of the RIGHT scapula measuring approximately 2.8 x 2.5 cm. IMPRESSION: 1. LEFT upper lobe pulmonary mass with associated hilar and mediastinal adenopathy. Findings would be more indicative of a primary pulmonary neoplasm given the location in the lack of any basilar nodules that would better fit the characteristics of metastasis. Biopsy may be helpful to determine whether this is the source of clear cell tumor in the abdomen or represents a second primary. 2. Diffuse interstitial thickening and subtle nodularity of pleural surfaces with a discrete pleural nodule seen distant from the dominant nodule in the LEFT upper lobe. Constellation of findings raising the question of lymphangitic carcinomatosis and additional sites of upper lobe involvement. Background interstitial lung disease is also possible. 3. Abdominal findings better displayed on recent abdominal and pelvic CT. Of note there is no discrete renal mass seen on the prior CT. Would consider other potential sites for clear cell neoplasm including the lung as differential possibilities that would explain recent biopsy results. Hepatic tumors may also display clear cell characteristics. 4. Mixed lucent and sclerotic process in the RIGHT glenoid in the absence of significant degenerative change raising the question of metastatic lesion to this area in the RIGHT scapula. MRI may be helpful as warranted for further evaluation. 5. Emphysema and aortic atherosclerosis. These results will be called to the ordering clinician or representative by the Radiologist Assistant, and communication documented in the PACS or [employer]. Aortic Atherosclerosis (ICD10-I70.0) and Emphysema (ICD10-J43.9). Electronically Signed   By: Zetta Bills M.D.   On: 12/11/2019 08:28   MR Brain W Wo Contrast  Result Date: 12/10/2019 CLINICAL DATA:  Initial evaluation for urologic cancer, staging. EXAM: MRI HEAD WITHOUT AND WITH  CONTRAST TECHNIQUE: Multiplanar, multiecho pulse sequences of the brain and surrounding structures were obtained without and with intravenous contrast. CONTRAST:  7.44mL GADAVIST GADOBUTROL 1 MMOL/ML IV SOLN COMPARISON:  None available. FINDINGS: Brain: Cerebral volume within normal limits for age. Mild scattered T2/FLAIR hyperintensity noted within the periventricular and deep white matter both cerebral hemispheres, nonspecific, but most like related chronic microvascular ischemic disease, mild for age. No evidence for acute infarct. Gray-white matter differentiation maintained. No encephalomalacia to suggest chronic cortical infarction. No evidence for acute or chronic intracranial hemorrhage. 11 mm focus of enhancement seen involving the cortex of the parasagittal right parietal lobe (series 15, image 102). Additional subtle 6 mm focus of patchy enhancement seen involving the right cerebellar vermis (series 15, image 50). Findings suspicious for possible metastatic disease. No associated mass effect or hemorrhage. No other mass lesion, mass effect, or midline shift. No hydrocephalus or extra-axial fluid collection. Pituitary gland suprasellar region within normal limits. Midline structures intact. Vascular: Major intracranial vascular flow voids are maintained. Skull and upper cervical spine: Craniocervical junction within normal limits. Bone marrow signal intensity normal. 9 mm soft tissue nodule present at the high right parietal scalp (series 11, image 44), indeterminate. Sinuses/Orbits: Globes and orbital soft tissues demonstrate no acute finding. Paranasal sinuses are clear. No significant mastoid effusion. Inner ear structures grossly normal. Other: None. IMPRESSION: 1. Two small foci of patchy enhancement involving the cortical gray matter of the right parietal lobe and right cerebellar vermis as above, suspicious for metastatic disease. No associated edema or mass effect. 2. 9 mm soft tissue nodule at the  right parietal scalp, indeterminate. Metastatic implant not excluded. Correlation with physical exam recommended. 3. Underlying mild chronic microvascular ischemic disease. Electronically Signed   By: Jeannine Boga M.D.   On: 12/10/2019 03:04  CT ABDOMEN PELVIS W CONTRAST  Result Date: 12/05/2019 CLINICAL DATA:  Stage IV renal cell carcinoma metastatic to lungs and liver, constipation EXAM: CT ABDOMEN AND PELVIS WITH CONTRAST TECHNIQUE: Multidetector CT imaging of the abdomen and pelvis was performed using the standard protocol following bolus administration of intravenous contrast. CONTRAST:  175mL OMNIPAQUE IOHEXOL 300 MG/ML  SOLN COMPARISON:  11/07/2019. FINDINGS: Lower chest: Bibasilar scarring and fibrosis, greatest in the subpleural location. Hypodense masses seen at the left hilum, in the region of the left atrial appendage. It is difficult to tell whether this is within the left atrial appendage or the left hilum. Hepatobiliary: There is extensive extrinsic mass effect on the liver from diffuse peritoneal metastases, which have progressed since prior study. Multiple lesions within the inferior aspect right lobe liver are again identified, increased in size. Index lesion image 41 measures 2.4 cm, previously having measured 2.0 cm. The gallbladder is unremarkable. Pancreas: The pancreas cannot be adequately visualized given the extensive mesenteric metastatic disease. Spleen: Extrinsic mass effect upon the spleen by enlarging peritoneal metastases. Adrenals/Urinary Tract: The adrenals are difficult to evaluate given the extensive metastatic disease. Kidneys enhance normally and symmetrically. Bladder is decompressed, with extrinsic mass effect from enlarging peritoneal metastases. Stomach/Bowel: Bowel is difficult to evaluate given the extensive and progressive peritoneal metastases. Minimal gas and stool can be seen throughout the colon. Partial visualization of fluid-filled loops of small bowel  in the right quadrant, though the majority of the small bowel is effaced by the peritoneal metastases. Vascular/Lymphatic: Stable atherosclerosis of the aorta. Discrete adenopathy cannot be identified, as there are extensive masses throughout the mesentery consistent with progressive metastatic disease. Reproductive: Prostate is unremarkable. Other: Diffuse progressive omental caking consistent with peritoneal metastases. This results in extrinsic mass effect upon all the abdominal viscera, though most pronounced along the liver and throughout the bowel. No free fluid or free gas. Musculoskeletal: There are no acute or destructive bony lesions. Reconstructed images demonstrate no additional findings. IMPRESSION: 1. Marked progression of omental caking and peritoneal metastases since prior study. Complete effacement the bowel, with progressive extrinsic mass effect upon the abdominal viscera as above. 2. Progressive hepatic metastases. 3. Left hilar metastasis versus thrombus within the left atrial appendage. Grossly stable. 4.  Aortic Atherosclerosis (ICD10-I70.0). Electronically Signed   By: Randa Ngo M.D.   On: 12/29/2019 15:12   DG Chest Portable 1 View  Result Date: 12/15/2019 CLINICAL DATA:  Vomiting. EXAM: PORTABLE CHEST 1 VIEW COMPARISON:  May 06, 2009. FINDINGS: The heart size and mediastinal contours are within normal limits. No pneumothorax or pleural effusion is noted. Left upper lobe airspace opacity is noted concerning for pneumonia. Minimal right basilar subsegmental atelectasis or infiltrate is noted. The visualized skeletal structures are unremarkable. IMPRESSION: Left upper lobe airspace opacity is noted concerning for pneumonia. Minimal right basilar subsegmental atelectasis or infiltrate is noted. Electronically Signed   By: Marijo Conception M.D.   On: 01/01/2020 13:31   Assessment and Plan:  Meta Hatchet 49 y.o. male with medical history significant for metastatic clear cell  carcinoma, likely of kidney origin.  Received his first cycle of chemotherapy on 12/12/2019 with pembrolizumab/axitinib.  He presented to the hospital with significant abdominal pain and pain from his hemorrhoids.  Unfortunately, CT scan showed marked progression of his cancer.  He has also had continued decline in his performance status and appetite.  CT scan results were discussed today with the patient and his mother who was at the bedside.  We discussed that his disease is progressing rapidly and additional chemotherapy would be unlikely to provide much additional benefit.  Recommend focusing on comfort and for him to establish care with hospice.  The patient may even be a candidate for residential hospice.  His biggest issue today is constipation and uncontrolled pain.  He will be seen by the palliative care team later today.  #Metastatic Clear Cell Renal Cancer #Poor PO Intake/Failure to Thrive #Intractable Abdominal Pain #Severe Constipation/Enlarged External Hemorrhoids --Discussed CT scan results with the patient and his mother I recommend transition to focusing on comfort and to establish with hospice.  May benefit from residential hospice. --Palliative care consult pending and discussed with Dr. Domingo Cocking.  Someone from his team will see today. --I think chemotherapy/immunotherapy is unlikely to be of much benefit. His prognosis with widespread clear cell is extremely poor, with remaining life span likely best measured in weeks at best. --Will start on lactulose to see if this helps with constipation. --Oncology will continue to follow.   Mikey Bussing, DNP, AGPCNP-BC, AOCNP

## 2019-12-26 NOTE — Consult Note (Signed)
Consultation Note Date: 12/26/2019   Patient Name: Aaron Davidson  DOB: 22-Nov-1970  MRN: [id number]  Age / Sex: 49 y.o., male  PCP: Maximiano Coss, NP Referring Physician: Modena Jansky, MD  Reason for Consultation: Establishing goals of care  HPI/Patient Profile: 49 y.o. male  with past medical history of renal cell carcinoma with peritoneal and liver mets admitted on 12/07/2019 with intractable abdominal/rectal pain and hypoglycemia, hyponatremia, hyperkalemia. He had connected with hospice but decided he wished to continue with his cancer treatment instead. Unfortunately Dr. Lorenso Courier has seen and recommending comfort care due to marked progression on CT scan and declining functional status.   Clinical Assessment and Goals of Care: I met today with Aaron Davidson and his mother, Aaron Davidson, at bedside. He is in terrible pain mostly from hemorrhoids and constipation. We discussed cancer progression and that are options are very limited. We discussed that treatment for his cancer would cause him more harm than benefit. Aaron Davidson shares that this is what Dr. Lorenso Courier told them as well. Aaron Davidson was not present for conversation with Dr. Lorenso Courier but says that he heard from the bathroom part of the conversation and is aware that his options are limited. Currently his main concern is pain management. We discussed low dose infusion to better stay ahead of his pain and he would like to pursue this option. They are aware that this can make him more sleepy and less interactive but I honestly feel that this is likely to happen with prn medication and progression of his illness as he is unable to eat or drink much.   We also spent some time discussing poor prognosis and his wishes for how to move forward. We discussed code status and the risks/harms vs limited benefit this would be for him at this stage in his disease trajectory. I told Aaron Davidson  that I would not recommend that he be resuscitated as this will not treat his cancer or benefit him in anyway but would cause him more pain and suffering. Aaron Davidson shared that she would not want him to go through this and would not want to have to make a decision to take him off of life support. Aaron Davidson agrees for DNR and shares that he has been trying to protect his family through this process and would not want to put them, or himself, through this. He would also be amendable to hospice options as well as they have had good experience with hospice with other family members in the past.   For now the plan is to continue with aggressive symptom management, DNR established, continue IVF and other support measures, allow for his adult children to come and visit with him. I did spend time speaking with Aaron Davidson privately regarding poor prognosis and emotional support. Offered chaplain services and they will let us know if they desire chaplain support.   All questions/concerns addressed. Emotional support provided.   Primary Decision Maker PATIENT with support of mother Aaron Davidson    SUMMARY OF RECOMMENDATIONS   - DNR - Initiate  hydromorphone infusion low dose - Continue supportive measures  Code Status/Advance Care Planning:  DNR   Symptom Management:   Pain: Brief relief from hydromorphone 1 mg IV for ~1 hour. Tolerating well. Will begin hydromorphone 0.5 mg/hr with 1 mg bolus every 30 min as needed. Attempts to maintain alertness for family visitation as much as his pain tolerance will allow. May titrate dosage as needed to better control pain.   Palliative Prophylaxis:   Bowel Regimen, Delirium Protocol, Frequent Pain Assessment, Oral Care and Turn Reposition  Additional Recommendations (Limitations, Scope, Preferences):  Full Comfort Care  Psycho-social/Spiritual:   Desire for further Chaplaincy support:yes  Additional Recommendations: Education on Hospice and Grief/Bereavement  Support  Prognosis:   Overall prognosis very poor. Days to weeks possible.   Discharge Planning: To Be Determined      Primary Diagnoses: Present on Admission: . Intractable abdominal pain   I have reviewed the medical record, interviewed the patient and family, and examined the patient. The following aspects are pertinent.  Past Medical History:  Diagnosis Date  . Cancer [facility])    Social History   Socioeconomic History  . Marital status: Divorced    Spouse name: Not on file  . Number of children: Not on file  . Years of education: Not on file  . Highest education level: Not on file  Occupational History  . Not on file  Tobacco Use  . Smoking status: Current Some Day Smoker    Packs/day: 2.00    Years: 38.00    Pack years: 76.00    Types: Cigarettes  . Smokeless tobacco: Never Used  Substance and Sexual Activity  . Alcohol use: Yes    Alcohol/week: 1.0 standard drink    Types: 1 Standard drinks or equivalent per week    Comment: rare  . Drug use: No  . Sexual activity: Not on file  Other Topics Concern  . Not on file  Social History Narrative  . Not on file   Social Determinants of Health   Financial Resource Strain:   . Difficulty of Paying Living Expenses: Not on file  Food Insecurity:   . Worried About Charity fundraiser in the Last Year: Not on file  . Ran Out of Food in the Last Year: Not on file  Transportation Needs:   . Lack of Transportation (Medical): Not on file  . Lack of Transportation (Non-Medical): Not on file  Physical Activity:   . Days of Exercise per Week: Not on file  . Minutes of Exercise per Session: Not on file  Stress:   . Feeling of Stress : Not on file  Social Connections:   . Frequency of Communication with Friends and Family: Not on file  . Frequency of Social Gatherings with Friends and Family: Not on file  . Attends Religious Services: Not on file  . Active Member of Clubs or Organizations: Not on file  . Attends English as a second language teacher Meetings: Not on file  . Marital Status: Not on file   Family History  Problem Relation Age of Onset  . Heart disease Father    Scheduled Meds: . dicyclomine  10 mg Oral TID AC & HS  . docusate sodium  100 mg Oral BID  . gabapentin  600 mg Oral BID  . morphine  15 mg Oral Q12H  . senna-docusate  1 tablet Oral BID   Continuous Infusions: . dextrose 5 % and 0.9% NaCl 100 mL/hr at 12/26/19 1005   PRN  Meds:.acetaminophen, ALPRAZolam, bisacodyl, diltiazem, hydrocortisone-pramoxine, HYDROmorphone (DILAUDID) injection, lactulose, ondansetron **OR** ondansetron (ZOFRAN) IV Allergies  Allergen Reactions  . No Known Allergies    Review of Systems  Constitutional: Positive for activity change, appetite change and fatigue.  Cardiovascular: Positive for leg swelling.  Gastrointestinal: Positive for constipation, nausea and rectal pain.  Neurological: Positive for weakness.    Physical Exam Vitals and nursing note reviewed.  Constitutional:      Appearance: He is cachectic. He is ill-appearing.  Cardiovascular:     Rate and Rhythm: Tachycardia present.  Pulmonary:     Effort: Pulmonary effort is normal. No tachypnea, accessory muscle usage or respiratory distress.  Abdominal:     General: There is distension.     Tenderness: There is abdominal tenderness.  Musculoskeletal:     Right lower leg: 3+ Edema present.     Left lower leg: 3+ Edema present.  Neurological:     Mental Status: He is oriented to person, place, and time. He is lethargic.     Vital Signs: BP 102/84   Pulse (!) 109   Temp 98 F (36.7 C) (Oral)   Resp 17   Ht 5' 11"  (1.803 m)   Wt 82 kg   SpO2 96%   BMI 25.21 kg/m  Pain Scale: 0-10 POSS *See Group Information*: 1-Acceptable,Awake and alert Pain Score: 5    SpO2: SpO2: 96 % O2 Device:SpO2: 96 % O2 Flow Rate: .   IO: Intake/output summary:   Intake/Output Summary (Last 24 hours) at 12/26/2019 1143 Last data filed at 12/26/2019  0501 Gross per 24 hour  Intake 3217.11 ml  Output --  Net 3217.11 ml    LBM: Last BM Date:  (Pt reports it has been a while) Baseline Weight: Weight: 82 kg Most recent weight: Weight: 82 kg     Palliative Assessment/Data:     Time In: 1200 Time Out: 1315 Time Total: 75  Greater than 50%  of this time was spent counseling and coordinating care related to the above assessment and plan.  Signed by: Vinie Sill, NP Palliative Medicine Team Pager # [phone] (M-F 8a-5p) Team Phone # [phone] (Nights/Weekends)

## 2019-12-26 NOTE — Plan of Care (Signed)

## 2019-12-26 NOTE — Progress Notes (Signed)
PROGRESS NOTE   Aaron Davidson  VXY:[id number]    DOB: 28-Jul-1970    DOA: 12/24/2019  PCP: Maximiano Coss, NP   I have briefly reviewed patients previous medical records in [facility].  Chief complaint: Abdominal pain  Brief Narrative:  49 year old male with PMH of renal cell carcinoma with peritoneal and liver mets, due to progressively worsening abdominal pain, nausea, vomiting, poor oral intake, progressive weakness, constipation and inability to have a BM.  In the ED noted to be hypoglycemic, hyponatremic and hyperkalemic.  Oncology and palliative care medicine consulted.   Assessment & Plan:  Active Problems:   Intractable abdominal pain   Omental metastasis (HCC)   Uncontrolled pain   Goals of care, counseling/discussion   DNR (do not resuscitate)   Palliative care by specialist   Renal cell carcinoma with peritoneal/liver/lung mets with intractable abdominal pain: CT abdomen shows marked progression of his cancer.  Associated poor performance status and appetite.  As per oncology, additional chemotherapy would be unlikely to provide much additional benefit and recommend transitioning to comfort oriented care and hospice.  Palliative care medicine input appreciated, transitioned to DNR, initiated Dilaudid low-dose infusion and full comfort care.  Prognosis days to weeks.  Hyponatremia: Sodium up to 130.  Likely multifactorial related to poor oral intake/dehydration and may be even SIADH.  Hyperkalemia: Potassium down to 5.  Hypoglycemia: Secondary to poor oral intake and liver mets.  Now comfort oriented care.  Thrombocytopenia: Noted.  LFT abnormalities/markedly elevated alkaline phosphatase: Likely related to liver mets.    DVT prophylaxis: SCDs Start: 12/31/2019 1846     Code Status: DNR  Family Communication: Discussed in detail with patient's mother at bedside, updated care and answered questions this morning. Disposition:  Status is:  Inpatient  Remains inpatient appropriate because:Inpatient level of care appropriate due to severity of illness   Dispo: The patient is from: Home              Anticipated d/c is to: TBD              Anticipated d/c date is: 3 days              Patient currently is not medically stable to d/c.        Consultants:   Medical oncology Palliative care medicine.  Procedures:   None.  Antimicrobials:    Anti-infectives (From admission, onward)   None        Subjective:  Ongoing abdominal pain.  As I walked into the patient's room, patient had just come out of the toilet and returned to the toilet to have a BM.  As per prior providers notes, patient spends hours in the toilet trying to have a BM.  Objective:   Vitals:   12/09/2019 2300 12/26/19 0108 12/26/19 0500 12/26/19 1432  BP: 114/82 (!) 107/91 102/84 101/72  Pulse: (!) 112 (!) 112 (!) 109 (!) 110  Resp:    14  Temp: 98 F (36.7 C) 97.7 F (36.5 C) 98 F (36.7 C) (!) 97.5 F (36.4 C)  TempSrc: Oral Oral Oral Oral  SpO2: 100% 97% 96% 95%  Weight:      Height:        General exam: Young male, moderately built and frail seen ambulating in the room.  Appears uncomfortable but in no overt pain. Respiratory system: Clear to auscultation. Respiratory effort normal. Cardiovascular system: S1 & S2 heard, RRR. No JVD, murmurs, rubs, gallops or clicks. No pedal edema.  Telemetry personally reviewed: SR-mild ST.  NSVT 12 beat noted overnight. Gastrointestinal system: Abdomen is nondistended, soft and nontender. No organomegaly or masses felt. Normal bowel sounds heard. Central nervous system: Alert and oriented. No focal neurological deficits. Extremities: Symmetric 5 x 5 power. Skin: No rashes, lesions or ulcers Psychiatry: Judgement and insight appear normal. Mood & affect appropriate.     Data Reviewed:   I have personally reviewed following labs and imaging studies   CBC: Recent Labs  Lab 12/31/2019 1247  12/09/2019 1304 12/26/19 0621  WBC 11.1*  --  10.6*  NEUTROABS 9.2*  --   --   HGB 15.8 16.0 14.6  HCT 45.1 47.0 41.7  MCV 94.2  --  95.9  PLT 138*  --  109*    Basic Metabolic Panel: Recent Labs  Lab 12/10/2019 1247 12/16/2019 1304 12/26/19 0621  NA 130* 129* 132*  K 5.3* 5.3* 5.0  CL 93* 93* 96*  CO2 19*  --  21*  GLUCOSE 62* 59* 68*  BUN 27* 26* 20  CREATININE 1.04 0.90 0.87  CALCIUM 7.8*  --  7.7*    Liver Function Tests: Recent Labs  Lab 12/08/2019 1247 12/26/19 0621  AST 49* 51*  ALT 23 23  ALKPHOS 4,252* 3,929*  BILITOT 1.7* 1.1  PROT 5.7* 5.2*  ALBUMIN 2.7* 2.5*    CBG: Recent Labs  Lab 12/11/2019 1400 12/13/2019 1550 12/07/2019 1802  GLUCAP 72 61* 157*    Microbiology Studies:   Recent Results (from the past 240 hour(s))  Respiratory Panel by RT PCR (Flu A&B, Covid) - Nasopharyngeal Swab     Status: None   Collection Time: 12/07/2019  2:37 PM   Specimen: Nasopharyngeal Swab  Result Value Ref Range Status   SARS Coronavirus 2 by RT PCR NEGATIVE NEGATIVE Final    Comment: (NOTE) SARS-CoV-2 target nucleic acids are NOT DETECTED.  The SARS-CoV-2 RNA is generally detectable in upper respiratoy specimens during the acute phase of infection. The lowest concentration of SARS-CoV-2 viral copies this assay can detect is 131 copies/mL. A negative result does not preclude SARS-Cov-2 infection and should not be used as the sole basis for treatment or other patient management decisions. A negative result may occur with  improper specimen collection/handling, submission of specimen other than nasopharyngeal swab, presence of viral mutation(s) within the areas targeted by this assay, and inadequate number of viral copies (<131 copies/mL). A negative result must be combined with clinical observations, patient history, and epidemiological information. The expected result is Negative.  Fact Sheet for Patients:  PinkCheek.be  Fact Sheet  for Healthcare Providers:  GravelBags.it  This test is no t yet approved or cleared by the [place] FDA and  has been authorized for detection and/or diagnosis of SARS-CoV-2 by FDA under an Emergency Use Authorization (EUA). This EUA will remain  in effect (meaning this test can be used) for the duration of the COVID-19 declaration under Section 564(b)(1) of the Act, 21 U.S.C. section 360bbb-3(b)(1), unless the authorization is terminated or revoked sooner.     Influenza A by PCR NEGATIVE NEGATIVE Final   Influenza B by PCR NEGATIVE NEGATIVE Final    Comment: (NOTE) The Xpert Xpress SARS-CoV-2/FLU/RSV assay is intended as an aid in  the diagnosis of influenza from Nasopharyngeal swab specimens and  should not be used as a sole basis for treatment. Nasal washings and  aspirates are unacceptable for Xpert Xpress SARS-CoV-2/FLU/RSV  testing.  Fact Sheet for Patients: PinkCheek.be  Fact Sheet for  Healthcare Providers: GravelBags.it  This test is not yet approved or cleared by the [place] and  has been authorized for detection and/or diagnosis of SARS-CoV-2 by  FDA under an Emergency Use Authorization (EUA). This EUA will remain  in effect (meaning this test can be used) for the duration of the  Covid-19 declaration under Section 564(b)(1) of the Act, 21  U.S.C. section 360bbb-3(b)(1), unless the authorization is  terminated or revoked. Performed at [facility], [place] [address], [place], [place] [address]      Radiology Studies:  CT ABDOMEN PELVIS W CONTRAST  Result Date: 12/23/2019 CLINICAL DATA:  Stage IV renal cell carcinoma metastatic to lungs and liver, constipation EXAM: CT ABDOMEN AND PELVIS WITH CONTRAST TECHNIQUE: Multidetector CT imaging of the abdomen and pelvis was performed using the standard protocol following bolus administration of intravenous  contrast. CONTRAST:  134mL OMNIPAQUE IOHEXOL 300 MG/ML  SOLN COMPARISON:  11/07/2019. FINDINGS: Lower chest: Bibasilar scarring and fibrosis, greatest in the subpleural location. Hypodense masses seen at the left hilum, in the region of the left atrial appendage. It is difficult to tell whether this is within the left atrial appendage or the left hilum. Hepatobiliary: There is extensive extrinsic mass effect on the liver from diffuse peritoneal metastases, which have progressed since prior study. Multiple lesions within the inferior aspect right lobe liver are again identified, increased in size. Index lesion image 41 measures 2.4 cm, previously having measured 2.0 cm. The gallbladder is unremarkable. Pancreas: The pancreas cannot be adequately visualized given the extensive mesenteric metastatic disease. Spleen: Extrinsic mass effect upon the spleen by enlarging peritoneal metastases. Adrenals/Urinary Tract: The adrenals are difficult to evaluate given the extensive metastatic disease. Kidneys enhance normally and symmetrically. Bladder is decompressed, with extrinsic mass effect from enlarging peritoneal metastases. Stomach/Bowel: Bowel is difficult to evaluate given the extensive and progressive peritoneal metastases. Minimal gas and stool can be seen throughout the colon. Partial visualization of fluid-filled loops of small bowel in the right quadrant, though the majority of the small bowel is effaced by the peritoneal metastases. Vascular/Lymphatic: Stable atherosclerosis of the aorta. Discrete adenopathy cannot be identified, as there are extensive masses throughout the mesentery consistent with progressive metastatic disease. Reproductive: Prostate is unremarkable. Other: Diffuse progressive omental caking consistent with peritoneal metastases. This results in extrinsic mass effect upon all the abdominal viscera, though most pronounced along the liver and throughout the bowel. No free fluid or free gas.  Musculoskeletal: There are no acute or destructive bony lesions. Reconstructed images demonstrate no additional findings. IMPRESSION: 1. Marked progression of omental caking and peritoneal metastases since prior study. Complete effacement the bowel, with progressive extrinsic mass effect upon the abdominal viscera as above. 2. Progressive hepatic metastases. 3. Left hilar metastasis versus thrombus within the left atrial appendage. Grossly stable. 4.  Aortic Atherosclerosis (ICD10-I70.0). Electronically Signed   By: Randa Ngo M.D.   On: 12/20/2019 15:12   DG Chest Portable 1 View  Result Date: 12/20/2019 CLINICAL DATA:  Vomiting. EXAM: PORTABLE CHEST 1 VIEW COMPARISON:  May 06, 2009. FINDINGS: The heart size and mediastinal contours are within normal limits. No pneumothorax or pleural effusion is noted. Left upper lobe airspace opacity is noted concerning for pneumonia. Minimal right basilar subsegmental atelectasis or infiltrate is noted. The visualized skeletal structures are unremarkable. IMPRESSION: Left upper lobe airspace opacity is noted concerning for pneumonia. Minimal right basilar subsegmental atelectasis or infiltrate is noted. Electronically Signed   By: Bobbe Medico.D.  On: 12/29/2019 13:31     Scheduled Meds:    dicyclomine  10 mg Oral TID AC & HS   docusate sodium  100 mg Oral BID   gabapentin  600 mg Oral BID   senna-docusate  1 tablet Oral BID    Continuous Infusions:    dextrose 5 % and 0.9% NaCl 100 mL/hr at 12/26/19 1005   HYDROmorphone 0.5 mg/hr (12/26/19 1437)     LOS: 1 day     Vernell Leep, MD, [place], [facility]. [facility]    To contact the attending provider between 7A-7P or the covering provider during after hours 7P-7A, please log into the web site [email or web address] and access using universal [place] password for that web site. If you do not have the password, please call the hospital operator.  12/26/2019, 5:05 PM

## 2019-12-27 DIAGNOSIS — Z7189 Other specified counseling: Secondary | ICD-10-CM | POA: Diagnosis not present

## 2019-12-27 DIAGNOSIS — C786 Secondary malignant neoplasm of retroperitoneum and peritoneum: Secondary | ICD-10-CM | POA: Diagnosis not present

## 2019-12-27 DIAGNOSIS — Z515 Encounter for palliative care: Secondary | ICD-10-CM | POA: Diagnosis not present

## 2019-12-27 DIAGNOSIS — E162 Hypoglycemia, unspecified: Secondary | ICD-10-CM | POA: Diagnosis not present

## 2019-12-27 DIAGNOSIS — R109 Unspecified abdominal pain: Secondary | ICD-10-CM | POA: Diagnosis not present

## 2019-12-27 MED ORDER — FUROSEMIDE 10 MG/ML IJ SOLN
40.0000 mg | Freq: Once | INTRAMUSCULAR | Status: AC
  Administered 2019-12-27: 40 mg via INTRAVENOUS
  Filled 2019-12-27: qty 4

## 2019-12-27 MED ORDER — GLYCOPYRROLATE 0.2 MG/ML IJ SOLN
0.4000 mg | Freq: Three times a day (TID) | INTRAMUSCULAR | Status: DC
  Administered 2019-12-27: 0.4 mg via INTRAVENOUS
  Filled 2019-12-27: qty 2

## 2019-12-27 MED ORDER — BENZONATATE 100 MG PO CAPS
200.0000 mg | ORAL_CAPSULE | Freq: Two times a day (BID) | ORAL | Status: DC | PRN
  Administered 2019-12-27: 200 mg via ORAL
  Filled 2019-12-27: qty 2

## 2019-12-27 MED ORDER — ALPRAZOLAM 0.25 MG PO TABS
0.2500 mg | ORAL_TABLET | Freq: Two times a day (BID) | ORAL | Status: DC | PRN
  Administered 2019-12-27: 0.25 mg via ORAL
  Filled 2019-12-27: qty 1

## 2019-12-27 MED ORDER — ALPRAZOLAM 0.25 MG PO TABS: 0.2500 mg | ORAL_TABLET | Freq: Three times a day (TID) | ORAL | Status: DC | PRN

## 2019-12-27 MED ORDER — GLYCOPYRROLATE 0.2 MG/ML IJ SOLN: 0.4000 mg | INTRAMUSCULAR | Status: DC | PRN

## 2019-12-27 MED ORDER — ALPRAZOLAM 0.5 MG PO TABS: 0.5000 mg | ORAL_TABLET | Freq: Every day | ORAL | Status: DC

## 2019-12-29 ENCOUNTER — Other Ambulatory Visit: Payer: 59

## 2019-12-29 ENCOUNTER — Ambulatory Visit: Payer: 59 | Admitting: Hematology and Oncology

## 2019-12-30 ENCOUNTER — Other Ambulatory Visit: Payer: 59

## 2020-01-02 ENCOUNTER — Ambulatory Visit: Payer: 59

## 2020-01-02 NOTE — Death Summary Note (Signed)
DEATH SUMMARY   Patient Details  Name: Aaron Davidson MRN: [id number] DOB: 1970-04-23  Admission/Discharge Information   Admit Date:  01/21/20  Date of Death: Date of Death: 01-23-2020  Time of Death: Time of Death: 1818/07/21  Length of Stay: 2  Referring Physician: Maximiano Coss, NP   Reason(s) for Hospitalization  Abdominal pain.  Diagnoses  Preliminary cause of death: Metastatic renal cell carcinoma Secondary Diagnoses (including complications and co-morbidities):  Active Problems:   Intractable abdominal pain   Omental metastasis (HCC)   Uncontrolled pain   Goals of care, counseling/discussion   DNR (do not resuscitate)   Palliative care by specialist   Brief Hospital Course (including significant findings, care, treatment, and services provided and events leading to death)    49 year old male with PMH of renal cell carcinoma with peritoneal and liver mets, presented due to progressively worsening abdominal pain, nausea, vomiting, poor oral intake, progressive weakness, constipation and inability to have a BM.  In the ED noted to be hypoglycemic, hyponatremic and hyperkalemic.  Oncology and palliative care medicine consulted.   Assessment & Plan:   Renal cell carcinoma with peritoneal/liver/lung/brain mets with intractable abdominal pain: CT abdomen shows marked progression of his cancer.  Associated poor performance status and appetite.  As per oncology, additional chemotherapy would be unlikely to provide much additional benefit and recommended transitioning to comfort oriented care and hospice.  Palliative care medicine consulted, transitioned to DNR, initiated Dilaudid low-dose infusion and full comfort care.  He developed dyspnea on night prior to his demise, likely multifactorial related to lung mets, possible pulmonary edema from IVF, discontinued IVF, IV Lasix 40 mg x 1 dose, as needed IV Dilaudid, continued Dilaudid infusion.  Changed Xanax to 0.25 mg twice daily  as needed for anxiety. He rapidly declined on 01-23-2020.  Discussed in detail with patient's mother and 2 sons (53 and 74) at bedside.  Palliative care medicine assisted with adjusting his comfort medications.  Family were open to the option of residential hospice.  However patient abruptly demised at 6:20 PM on 2020/01/23.  Acute respiratory failure with hypoxia: As discussed above.  Oxygen for comfort.  Hyponatremia: Sodium up to 130.  Likely multifactorial related to poor oral intake/dehydration and may be even SIADH.  Hyperkalemia: Potassium down to 5.  Hypoglycemia: Secondary to poor oral intake and liver mets.  Now comfort oriented care.  Thrombocytopenia: Noted.  LFT abnormalities/markedly elevated alkaline phosphatase: Likely related to liver mets.     Consultants:   Medical oncology Palliative care medicine.  Procedures:   None.   Pertinent Labs and Studies  Significant Diagnostic Studies CT Chest Wo Contrast  Result Date: 12/11/2019 CLINICAL DATA:  Dyspnea, chronic of unclear etiology. EXAM: CT CHEST WITHOUT CONTRAST TECHNIQUE: Multidetector CT imaging of the chest was performed following the standard protocol without IV contrast. COMPARISON:  CT abdomen and pelvis November 07, 2019 FINDINGS: Cardiovascular: Heart size is normal without pericardial effusion. Minimal calcified atheromatous plaque in the thoracic aorta. No aneurysmal dilation. Central pulmonary vasculature of normal caliber. Limited assessment of heart and cardiovascular structures in general without intravenous contrast. Mediastinum/Nodes: Thoracic inlet structures are normal. No axillary lymphadenopathy. Bulky AP window and LEFT paratracheal adenopathy as well as LEFT hilar adenopathy. As a conglomerate of lymph nodes in the LEFT paratracheal and AP window regions a 4.4 x 2.8 cm nodal mass is noted. LEFT hilar and suprahilar nodal disease (image 67, series 8) 3.5 x 3.3 cm. Pre-vascular lymph nodes  (image 60, series 2)  1.5 cm short axis lymph node. Enlarged lymph nodes track up through the anterior mediastinum with the retrosternal pre-vascular node measuring 1 cm on image 48 of series 2. Scattered small lymph nodes in the RIGHT mediastinum. No gross RIGHT hilar adenopathy. Esophagus grossly normal. Lungs/Pleura: Signs of extensive subpleural reticulation and septal thickening on a background of both paraseptal and centrilobular emphysema. Basilar atelectasis on the RIGHT overlying the RIGHT hemidiaphragm which is elevated in the setting of diffuse abdominal tumor. Lobular LEFT upper lobe pulmonary mass measures 4 x 2.1 cm (image 45, series 8) this is found along the pleural surface in the LEFT upper lobe antral laterally with associated pleural thickening. There is also a discrete nodule seen inferiorly along the pleura (image 66, series 8) 9 mm. Subtle pleural nodularity along the major fissure in the LEFT chest. No pericardial effusion. No pleural effusion. Airways are patent. Wile patent LEFT upper lobe airways are narrowed at the LEFT hilum due to LEFT hilar mass. Lung bases without signs of nodules or masses. Upper Abdomen: Incidental imaging of upper abdominal contents again shows diffuse peritoneal involvement and hepatic parenchymal involvement. Areas not well evaluated given lack of intravenous contrast, grossly unchanged compared to August 6th with extensive subdiaphragmatic, hepatic and peritoneal disease. Musculoskeletal: No acute musculoskeletal process. No destructive bone finding. There is mixed lucent and sclerotic change in the RIGHT glenoid in the absence of additional signs of degenerative process. This tracks into the body of the RIGHT scapula measuring approximately 2.8 x 2.5 cm. IMPRESSION: 1. LEFT upper lobe pulmonary mass with associated hilar and mediastinal adenopathy. Findings would be more indicative of a primary pulmonary neoplasm given the location in the lack of any basilar  nodules that would better fit the characteristics of metastasis. Biopsy may be helpful to determine whether this is the source of clear cell tumor in the abdomen or represents a second primary. 2. Diffuse interstitial thickening and subtle nodularity of pleural surfaces with a discrete pleural nodule seen distant from the dominant nodule in the LEFT upper lobe. Constellation of findings raising the question of lymphangitic carcinomatosis and additional sites of upper lobe involvement. Background interstitial lung disease is also possible. 3. Abdominal findings better displayed on recent abdominal and pelvic CT. Of note there is no discrete renal mass seen on the prior CT. Would consider other potential sites for clear cell neoplasm including the lung as differential possibilities that would explain recent biopsy results. Hepatic tumors may also display clear cell characteristics. 4. Mixed lucent and sclerotic process in the RIGHT glenoid in the absence of significant degenerative change raising the question of metastatic lesion to this area in the RIGHT scapula. MRI may be helpful as warranted for further evaluation. 5. Emphysema and aortic atherosclerosis. These results will be called to the ordering clinician or representative by the Radiologist Assistant, and communication documented in the PACS or [employer]. Aortic Atherosclerosis (ICD10-I70.0) and Emphysema (ICD10-J43.9). Electronically Signed   By: Zetta Bills M.D.   On: 12/11/2019 08:28   MR Brain W Wo Contrast  Result Date: 12/10/2019 CLINICAL DATA:  Initial evaluation for urologic cancer, staging. EXAM: MRI HEAD WITHOUT AND WITH CONTRAST TECHNIQUE: Multiplanar, multiecho pulse sequences of the brain and surrounding structures were obtained without and with intravenous contrast. CONTRAST:  7.34mL GADAVIST GADOBUTROL 1 MMOL/ML IV SOLN COMPARISON:  None available. FINDINGS: Brain: Cerebral volume within normal limits for age. Mild scattered  T2/FLAIR hyperintensity noted within the periventricular and deep white matter both cerebral hemispheres, nonspecific, but  most like related chronic microvascular ischemic disease, mild for age. No evidence for acute infarct. Gray-white matter differentiation maintained. No encephalomalacia to suggest chronic cortical infarction. No evidence for acute or chronic intracranial hemorrhage. 11 mm focus of enhancement seen involving the cortex of the parasagittal right parietal lobe (series 15, image 102). Additional subtle 6 mm focus of patchy enhancement seen involving the right cerebellar vermis (series 15, image 50). Findings suspicious for possible metastatic disease. No associated mass effect or hemorrhage. No other mass lesion, mass effect, or midline shift. No hydrocephalus or extra-axial fluid collection. Pituitary gland suprasellar region within normal limits. Midline structures intact. Vascular: Major intracranial vascular flow voids are maintained. Skull and upper cervical spine: Craniocervical junction within normal limits. Bone marrow signal intensity normal. 9 mm soft tissue nodule present at the high right parietal scalp (series 11, image 44), indeterminate. Sinuses/Orbits: Globes and orbital soft tissues demonstrate no acute finding. Paranasal sinuses are clear. No significant mastoid effusion. Inner ear structures grossly normal. Other: None. IMPRESSION: 1. Two small foci of patchy enhancement involving the cortical gray matter of the right parietal lobe and right cerebellar vermis as above, suspicious for metastatic disease. No associated edema or mass effect. 2. 9 mm soft tissue nodule at the right parietal scalp, indeterminate. Metastatic implant not excluded. Correlation with physical exam recommended. 3. Underlying mild chronic microvascular ischemic disease. Electronically Signed   By: Jeannine Boga M.D.   On: 12/10/2019 03:04   CT ABDOMEN PELVIS W CONTRAST  Result Date:  12/18/2019 CLINICAL DATA:  Stage IV renal cell carcinoma metastatic to lungs and liver, constipation EXAM: CT ABDOMEN AND PELVIS WITH CONTRAST TECHNIQUE: Multidetector CT imaging of the abdomen and pelvis was performed using the standard protocol following bolus administration of intravenous contrast. CONTRAST:  122mL OMNIPAQUE IOHEXOL 300 MG/ML  SOLN COMPARISON:  11/07/2019. FINDINGS: Lower chest: Bibasilar scarring and fibrosis, greatest in the subpleural location. Hypodense masses seen at the left hilum, in the region of the left atrial appendage. It is difficult to tell whether this is within the left atrial appendage or the left hilum. Hepatobiliary: There is extensive extrinsic mass effect on the liver from diffuse peritoneal metastases, which have progressed since prior study. Multiple lesions within the inferior aspect right lobe liver are again identified, increased in size. Index lesion image 41 measures 2.4 cm, previously having measured 2.0 cm. The gallbladder is unremarkable. Pancreas: The pancreas cannot be adequately visualized given the extensive mesenteric metastatic disease. Spleen: Extrinsic mass effect upon the spleen by enlarging peritoneal metastases. Adrenals/Urinary Tract: The adrenals are difficult to evaluate given the extensive metastatic disease. Kidneys enhance normally and symmetrically. Bladder is decompressed, with extrinsic mass effect from enlarging peritoneal metastases. Stomach/Bowel: Bowel is difficult to evaluate given the extensive and progressive peritoneal metastases. Minimal gas and stool can be seen throughout the colon. Partial visualization of fluid-filled loops of small bowel in the right quadrant, though the majority of the small bowel is effaced by the peritoneal metastases. Vascular/Lymphatic: Stable atherosclerosis of the aorta. Discrete adenopathy cannot be identified, as there are extensive masses throughout the mesentery consistent with progressive metastatic  disease. Reproductive: Prostate is unremarkable. Other: Diffuse progressive omental caking consistent with peritoneal metastases. This results in extrinsic mass effect upon all the abdominal viscera, though most pronounced along the liver and throughout the bowel. No free fluid or free gas. Musculoskeletal: There are no acute or destructive bony lesions. Reconstructed images demonstrate no additional findings. IMPRESSION: 1. Marked progression of omental caking and peritoneal  metastases since prior study. Complete effacement the bowel, with progressive extrinsic mass effect upon the abdominal viscera as above. 2. Progressive hepatic metastases. 3. Left hilar metastasis versus thrombus within the left atrial appendage. Grossly stable. 4.  Aortic Atherosclerosis (ICD10-I70.0). Electronically Signed   By: Randa Ngo M.D.   On: 12/22/2019 15:12   DG Chest Portable 1 View  Result Date: 12/15/2019 CLINICAL DATA:  Vomiting. EXAM: PORTABLE CHEST 1 VIEW COMPARISON:  May 06, 2009. FINDINGS: The heart size and mediastinal contours are within normal limits. No pneumothorax or pleural effusion is noted. Left upper lobe airspace opacity is noted concerning for pneumonia. Minimal right basilar subsegmental atelectasis or infiltrate is noted. The visualized skeletal structures are unremarkable. IMPRESSION: Left upper lobe airspace opacity is noted concerning for pneumonia. Minimal right basilar subsegmental atelectasis or infiltrate is noted. Electronically Signed   By: Marijo Conception M.D.   On: 12/16/2019 13:31    Microbiology Recent Results (from the past 240 hour(s))  Respiratory Panel by RT PCR (Flu A&B, Covid) - Nasopharyngeal Swab     Status: None   Collection Time: 12/03/2019  2:37 PM   Specimen: Nasopharyngeal Swab  Result Value Ref Range Status   SARS Coronavirus 2 by RT PCR NEGATIVE NEGATIVE Final    Comment: (NOTE) SARS-CoV-2 target nucleic acids are NOT DETECTED.  The SARS-CoV-2 RNA is generally  detectable in upper respiratoy specimens during the acute phase of infection. The lowest concentration of SARS-CoV-2 viral copies this assay can detect is 131 copies/mL. A negative result does not preclude SARS-Cov-2 infection and should not be used as the sole basis for treatment or other patient management decisions. A negative result may occur with  improper specimen collection/handling, submission of specimen other than nasopharyngeal swab, presence of viral mutation(s) within the areas targeted by this assay, and inadequate number of viral copies (<131 copies/mL). A negative result must be combined with clinical observations, patient history, and epidemiological information. The expected result is Negative.  Fact Sheet for Patients:  PinkCheek.be  Fact Sheet for Healthcare Providers:  GravelBags.it  This test is no t yet approved or cleared by the [place] FDA and  has been authorized for detection and/or diagnosis of SARS-CoV-2 by FDA under an Emergency Use Authorization (EUA). This EUA will remain  in effect (meaning this test can be used) for the duration of the COVID-19 declaration under Section 564(b)(1) of the Act, 21 U.S.C. section 360bbb-3(b)(1), unless the authorization is terminated or revoked sooner.     Influenza A by PCR NEGATIVE NEGATIVE Final   Influenza B by PCR NEGATIVE NEGATIVE Final    Comment: (NOTE) The Xpert Xpress SARS-CoV-2/FLU/RSV assay is intended as an aid in  the diagnosis of influenza from Nasopharyngeal swab specimens and  should not be used as a sole basis for treatment. Nasal washings and  aspirates are unacceptable for Xpert Xpress SARS-CoV-2/FLU/RSV  testing.  Fact Sheet for Patients: PinkCheek.be  Fact Sheet for Healthcare Providers: GravelBags.it  This test is not yet approved or cleared by the [place] FDA and   has been authorized for detection and/or diagnosis of SARS-CoV-2 by  FDA under an Emergency Use Authorization (EUA). This EUA will remain  in effect (meaning this test can be used) for the duration of the  Covid-19 declaration under Section 564(b)(1) of the Act, 21  U.S.C. section 360bbb-3(b)(1), unless the authorization is  terminated or revoked. Performed at [facility], [place] [address] [place], [place] [address]  Lab Basic Metabolic Panel: Recent Labs  Lab 12/13/2019 1247 12/24/2019 1304 12/26/19 0621  NA 130* 129* 132*  K 5.3* 5.3* 5.0  CL 93* 93* 96*  CO2 19*  --  21*  GLUCOSE 62* 59* 68*  BUN 27* 26* 20  CREATININE 1.04 0.90 0.87  CALCIUM 7.8*  --  7.7*   Liver Function Tests: Recent Labs  Lab 12/24/2019 1247 12/26/19 0621  AST 49* 51*  ALT 23 23  ALKPHOS 4,252* 3,929*  BILITOT 1.7* 1.1  PROT 5.7* 5.2*  ALBUMIN 2.7* 2.5*   Recent Labs  Lab 12/24/2019 1247  LIPASE 20   No results for input(s): AMMONIA in the last 168 hours. CBC: Recent Labs  Lab 12/16/2019 1247 12/26/2019 1304 12/26/19 0621  WBC 11.1*  --  10.6*  NEUTROABS 9.2*  --   --   HGB 15.8 16.0 14.6  HCT 45.1 47.0 41.7  MCV 94.2  --  95.9  PLT 138*  --  109*   Cardiac Enzymes: No results for input(s): CKTOTAL, CKMB, CKMBINDEX, TROPONINI in the last 168 hours. Sepsis Labs: Recent Labs  Lab 12/17/2019 1247 12/20/2019 1551 12/06/2019 1815 12/26/19 0621  WBC 11.1*  --   --  10.6*  LATICACIDVEN  --  3.1* 2.6*  --     Procedures/Operations     [employer] 12/28/2019, 6:50 AM

## 2020-01-02 NOTE — Progress Notes (Signed)
Pt with c/o " not being able to breath". O2 sats 67% on RA. Pt is a mouth breather. O2 applied at 2 liters [place]. O2 sats gradually went up to the 70's. Increased to 4 liters [place]. O2 sats up to 91%. Pt resting quietly in bed with eyes opened. Mother remains at bedside. Will cont to monitor and care for pt.

## 2020-01-02 NOTE — Progress Notes (Signed)
Palliative:  HPI: 49 y.o. male  with past medical history of renal cell carcinoma with peritoneal and liver mets admitted on 12/17/2019 with intractable abdominal/rectal pain and hypoglycemia, hyponatremia, hyperkalemia. He had connected with hospice but decided he wished to continue with his cancer treatment instead. Unfortunately Dr. Lorenso Davidson has seen and recommending comfort care due to marked progression on CT scan and declining functional status.   I met today at "Aaron Davidson's" bedside with mother and niece. Aaron Davidson is initially sitting up on side of bed but is sleepy and rests down in bed and asleep for most of my visit. We discussed his comfort level and he had an extremely difficult night and morning. This is very disappointing because he was very comfortable at the end of the day yesterday. Sounds like he was restless and then oxygen dropped due to fluid overload. He was given Lasix this morning and fluids stopped. We discussed that he will not be able to tolerate further IV fluids and this is okay. We discussed that he is on an extremely low dose of medication and I would recommend to increase hydromorphone as this will assist with pain and breathing.   We spent time discussing confusion and lethargy and that this will be expected to continue. He will become weaker and ultimately just sleeping and not waking back up. We discussed goal for comfort as relief from pain and discomfort is the best we can treat for him. Aaron Davidson did speak with his sons and they had a chance to speak with Aaron Davidson today as well. Open to transition to [facility].   All questions/concerns addressed. Therapeutic listening provided. Emotional support provided.   Exam: Lethargic. Mild-mod shortness of breath with activity but good at rest. No distress. Restless at times. Abd distended. Hemorrhoids (not assessed).   Plan:  - Open to transition to [facility].  - Continue hydromorphone infusion at 0.5 mg/hr but low threshold to  increase basal rate (mother is hesitant otherwise I would have increased today). Continue with liberal bolus as well.  - Insomnia and increased anxiety: Xanax 0.5 mg po qhs and 0.25 mg as needed.  - Secretions/fluid build up: Robinul 0.4 mg IV three times daily and as needed in between scheduled doses.   [place], NP Palliative Medicine Team Pager [phone] (Please see [email or web address] for schedule) Team Phone [phone]    Greater than 50%  of this time was spent counseling and coordinating care related to the above assessment and plan

## 2020-01-02 NOTE — Progress Notes (Signed)
Pt still complaining of not being able to breath. MD Notified, orders given.

## 2020-01-02 NOTE — Progress Notes (Signed)
PROGRESS NOTE   Aaron SHAMOON  HQR:[id number]    DOB: 09-May-1970    DOA: 12/26/2019  PCP: Maximiano Coss, NP   I have briefly reviewed patients previous medical records in [facility].  Chief complaint: Abdominal pain  Brief Narrative:  49 year old male with PMH of renal cell carcinoma with peritoneal and liver mets, due to progressively worsening abdominal pain, nausea, vomiting, poor oral intake, progressive weakness, constipation and inability to have a BM.  In the ED noted to be hypoglycemic, hyponatremic and hyperkalemic.  Oncology and palliative care medicine consulted.   Assessment & Plan:  Active Problems:   Intractable abdominal pain   Omental metastasis (HCC)   Uncontrolled pain   Goals of care, counseling/discussion   DNR (do not resuscitate)   Palliative care by specialist   Renal cell carcinoma with peritoneal/liver/lung/brain mets with intractable abdominal pain: CT abdomen shows marked progression of his cancer.  Associated poor performance status and appetite.  As per oncology, additional chemotherapy would be unlikely to provide much additional benefit and recommend transitioning to comfort oriented care and hospice.  Palliative care medicine input appreciated, transitioned to DNR, initiated Dilaudid low-dose infusion and full comfort care.  Prognosis days to weeks.  Now with dyspnea, likely multifactorial related to lung mets, possible pulmonary edema from IVF, discontinued IVF, IV Lasix 40 mg x 1 dose, as needed IV Dilaudid, continue Dilaudid infusion but may have to increase dose-defer to palliative care follow-up later today.  Changed Xanax to 0.25 mg twice daily as needed for anxiety.  Appears to be rapidly declining.  Discussed in detail with patient's mother and 2 sons (44 and 84) at bedside.  Acute respiratory failure with hypoxia: As discussed above.  Oxygen for comfort.  Hyponatremia: Sodium up to 130.  Likely multifactorial related to poor oral  intake/dehydration and may be even SIADH.  Hyperkalemia: Potassium down to 5.  Hypoglycemia: Secondary to poor oral intake and liver mets.  Now comfort oriented care.  Thrombocytopenia: Noted.  LFT abnormalities/markedly elevated alkaline phosphatase: Likely related to liver mets.    DVT prophylaxis: SCDs Start: 12/12/2019 1846     Code Status: DNR  Family Communication: Discussed in detail with patient's mother and 2 sons at bedside, updated care and answered questions this morning. Disposition:  Status is: Inpatient  Remains inpatient appropriate because:Inpatient level of care appropriate due to severity of illness   Dispo: The patient is from: Home              Anticipated d/c is to: TBD              Anticipated d/c date is: 3 days              Patient currently is not medically stable to d/c.        Consultants:   Medical oncology Palliative care medicine.  Procedures:   None.  Antimicrobials:    Anti-infectives (From admission, onward)   None        Subjective:  RN communicated this morning that patient was complaining of shortness of breath and was restless.  Overnight events noted, hypoxic.  When I went to see him, again in the toilet sitting on bedside commode wanting to have a BM but cannot.  Stated that his breathing was better after IV Lasix and a as needed dose of IV Dilaudid.  Objective:   Vitals:   12/26/19 0108 12/26/19 0500 12/26/19 1432 13-Jan-2020 0610  BP: (!) 107/91 102/84 101/72 (!) 103/91  Pulse: (!) 112 (!) 109 (!) 110 (!) 138  Resp:   14 (!) 25  Temp: 97.7 F (36.5 C) 98 F (36.7 C) (!) 97.5 F (36.4 C) 97.7 F (36.5 C)  TempSrc: Oral Oral Oral Oral  SpO2: 97% 96% 95% 91%  Weight:      Height:        General exam: Young male, moderately built and frail seen ambulating in the room.  Appeared mildly dyspneic but not in overt distress. Respiratory system: Diminished and harsh breath sounds anteriorly.  Mostly clear posteriorly.   Mild tachypnea.  Few anterior crackles but no wheezing or rhonchi. Cardiovascular system: S1 & S2 heard, RRR. No JVD, murmurs, rubs, gallops or clicks. No pedal edema.   Gastrointestinal system: Abdomen is nondistended, soft and nontender. No organomegaly or masses felt. Normal bowel sounds heard. Central nervous system: Alert and oriented. No focal neurological deficits. Extremities: Symmetric 5 x 5 power. Skin: No rashes, lesions or ulcers Psychiatry: Judgement and insight appear normal. Mood & affect appropriate and did not appear restless or anxious to me.     Data Reviewed:   I have personally reviewed following labs and imaging studies   CBC: Recent Labs  Lab 01/01/2020 1247 12/21/2019 1304 12/26/19 0621  WBC 11.1*  --  10.6*  NEUTROABS 9.2*  --   --   HGB 15.8 16.0 14.6  HCT 45.1 47.0 41.7  MCV 94.2  --  95.9  PLT 138*  --  109*    Basic Metabolic Panel: Recent Labs  Lab 12/03/2019 1247 12/12/2019 1304 12/26/19 0621  NA 130* 129* 132*  K 5.3* 5.3* 5.0  CL 93* 93* 96*  CO2 19*  --  21*  GLUCOSE 62* 59* 68*  BUN 27* 26* 20  CREATININE 1.04 0.90 0.87  CALCIUM 7.8*  --  7.7*    Liver Function Tests: Recent Labs  Lab 12/12/2019 1247 12/26/19 0621  AST 49* 51*  ALT 23 23  ALKPHOS 4,252* 3,929*  BILITOT 1.7* 1.1  PROT 5.7* 5.2*  ALBUMIN 2.7* 2.5*    CBG: Recent Labs  Lab 12/26/2019 1400 12/14/2019 1550 12/22/2019 1802  GLUCAP 72 61* 157*    Microbiology Studies:   Recent Results (from the past 240 hour(s))  Respiratory Panel by RT PCR (Flu A&B, Covid) - Nasopharyngeal Swab     Status: None   Collection Time: 12/26/2019  2:37 PM   Specimen: Nasopharyngeal Swab  Result Value Ref Range Status   SARS Coronavirus 2 by RT PCR NEGATIVE NEGATIVE Final    Comment: (NOTE) SARS-CoV-2 target nucleic acids are NOT DETECTED.  The SARS-CoV-2 RNA is generally detectable in upper respiratoy specimens during the acute phase of infection. The lowest concentration of  SARS-CoV-2 viral copies this assay can detect is 131 copies/mL. A negative result does not preclude SARS-Cov-2 infection and should not be used as the sole basis for treatment or other patient management decisions. A negative result may occur with  improper specimen collection/handling, submission of specimen other than nasopharyngeal swab, presence of viral mutation(s) within the areas targeted by this assay, and inadequate number of viral copies (<131 copies/mL). A negative result must be combined with clinical observations, patient history, and epidemiological information. The expected result is Negative.  Fact Sheet for Patients:  PinkCheek.be  Fact Sheet for Healthcare Providers:  GravelBags.it  This test is no t yet approved or cleared by the [place] FDA and  has been authorized for detection and/or diagnosis of SARS-CoV-2 by  FDA under an Emergency Use Authorization (EUA). This EUA will remain  in effect (meaning this test can be used) for the duration of the COVID-19 declaration under Section 564(b)(1) of the Act, 21 U.S.C. section 360bbb-3(b)(1), unless the authorization is terminated or revoked sooner.     Influenza A by PCR NEGATIVE NEGATIVE Final   Influenza B by PCR NEGATIVE NEGATIVE Final    Comment: (NOTE) The Xpert Xpress SARS-CoV-2/FLU/RSV assay is intended as an aid in  the diagnosis of influenza from Nasopharyngeal swab specimens and  should not be used as a sole basis for treatment. Nasal washings and  aspirates are unacceptable for Xpert Xpress SARS-CoV-2/FLU/RSV  testing.  Fact Sheet for Patients: PinkCheek.be  Fact Sheet for Healthcare Providers: GravelBags.it  This test is not yet approved or cleared by the [place] FDA and  has been authorized for detection and/or diagnosis of SARS-CoV-2 by  FDA under an Emergency Use  Authorization (EUA). This EUA will remain  in effect (meaning this test can be used) for the duration of the  Covid-19 declaration under Section 564(b)(1) of the Act, 21  U.S.C. section 360bbb-3(b)(1), unless the authorization is  terminated or revoked. Performed at [facility], [place] [address], [place], [place] [address]      Radiology Studies:  CT ABDOMEN PELVIS W CONTRAST  Result Date: 12/14/2019 CLINICAL DATA:  Stage IV renal cell carcinoma metastatic to lungs and liver, constipation EXAM: CT ABDOMEN AND PELVIS WITH CONTRAST TECHNIQUE: Multidetector CT imaging of the abdomen and pelvis was performed using the standard protocol following bolus administration of intravenous contrast. CONTRAST:  141mL OMNIPAQUE IOHEXOL 300 MG/ML  SOLN COMPARISON:  11/07/2019. FINDINGS: Lower chest: Bibasilar scarring and fibrosis, greatest in the subpleural location. Hypodense masses seen at the left hilum, in the region of the left atrial appendage. It is difficult to tell whether this is within the left atrial appendage or the left hilum. Hepatobiliary: There is extensive extrinsic mass effect on the liver from diffuse peritoneal metastases, which have progressed since prior study. Multiple lesions within the inferior aspect right lobe liver are again identified, increased in size. Index lesion image 41 measures 2.4 cm, previously having measured 2.0 cm. The gallbladder is unremarkable. Pancreas: The pancreas cannot be adequately visualized given the extensive mesenteric metastatic disease. Spleen: Extrinsic mass effect upon the spleen by enlarging peritoneal metastases. Adrenals/Urinary Tract: The adrenals are difficult to evaluate given the extensive metastatic disease. Kidneys enhance normally and symmetrically. Bladder is decompressed, with extrinsic mass effect from enlarging peritoneal metastases. Stomach/Bowel: Bowel is difficult to evaluate given the extensive and progressive peritoneal  metastases. Minimal gas and stool can be seen throughout the colon. Partial visualization of fluid-filled loops of small bowel in the right quadrant, though the majority of the small bowel is effaced by the peritoneal metastases. Vascular/Lymphatic: Stable atherosclerosis of the aorta. Discrete adenopathy cannot be identified, as there are extensive masses throughout the mesentery consistent with progressive metastatic disease. Reproductive: Prostate is unremarkable. Other: Diffuse progressive omental caking consistent with peritoneal metastases. This results in extrinsic mass effect upon all the abdominal viscera, though most pronounced along the liver and throughout the bowel. No free fluid or free gas. Musculoskeletal: There are no acute or destructive bony lesions. Reconstructed images demonstrate no additional findings. IMPRESSION: 1. Marked progression of omental caking and peritoneal metastases since prior study. Complete effacement the bowel, with progressive extrinsic mass effect upon the abdominal viscera as above. 2. Progressive hepatic metastases. 3. Left hilar metastasis versus  thrombus within the left atrial appendage. Grossly stable. 4.  Aortic Atherosclerosis (ICD10-I70.0). Electronically Signed   By: Randa Ngo M.D.   On: 12/29/2019 15:12     Scheduled Meds:   . dicyclomine  10 mg Oral TID AC & HS  . docusate sodium  100 mg Oral BID  . gabapentin  600 mg Oral BID  . senna-docusate  1 tablet Oral BID    Continuous Infusions:   . HYDROmorphone 0.5 mg/hr (January 14, 2020 0913)     LOS: 2 days     Vernell Leep, MD, [place], [facility]. [facility]    To contact the attending provider between 7A-7P or the covering provider during after hours 7P-7A, please log into the web site [email or web address] and access using universal [place] password for that web site. If you do not have the password, please call the hospital operator.  2020-01-14, 2:34 PM

## 2020-01-02 DEATH — deceased

## 2020-01-07 ENCOUNTER — Ambulatory Visit: Payer: 59 | Admitting: Hematology and Oncology

## 2020-01-07 ENCOUNTER — Other Ambulatory Visit: Payer: 59

## 2020-01-07 ENCOUNTER — Other Ambulatory Visit: Payer: Self-pay | Admitting: Registered Nurse

## 2020-01-07 DIAGNOSIS — M5432 Sciatica, left side: Secondary | ICD-10-CM

## 2020-01-21 ENCOUNTER — Other Ambulatory Visit: Payer: 59

## 2020-01-21 ENCOUNTER — Ambulatory Visit: Payer: 59 | Admitting: Hematology and Oncology

## 2020-01-21 ENCOUNTER — Ambulatory Visit: Payer: 59

## 2020-02-05 ENCOUNTER — Other Ambulatory Visit: Payer: Self-pay | Admitting: Registered Nurse

## 2020-02-05 DIAGNOSIS — F411 Generalized anxiety disorder: Secondary | ICD-10-CM

## 2022-01-29 IMAGING — MR MR HEAD WO/W CM
12 series · 48 of 48 positions shown · IV contrast (gadavist)
Comparison: None available.

CLINICAL DATA: Initial evaluation for urologic cancer, staging.

EXAM:
MRI HEAD WITHOUT AND WITH CONTRAST
TECHNIQUE: Multiplanar, multiecho pulse sequences of the brain and surrounding
structures were obtained without and with intravenous contrast.
CONTRAST:  7.5mL GADAVIST GADOBUTROL 1 MMOL/ML IV SOLN

[Series 5: T1 · sagittal · 3.0mm · 0.75mm/px · 1 of 30 slices shown (1 of 2)]
[im 1/30]
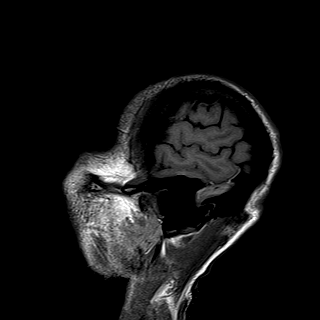

[Series 6: DWI · axial · 3.0mm · 2.09mm/px · z∈[-19,+116]mm · 5 of 92 slices shown (1 of 2)]
[im 1/92]
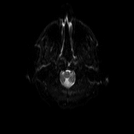
[im 23/92]
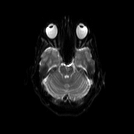
[im 46/92]
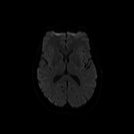
[im 69/92]
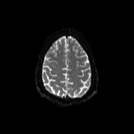
[im 92/92]
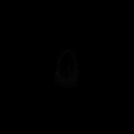

[Series 7: DWI · axial · 3.0mm · 2.09mm/px · z∈[-19,+116]mm · 3 of 46 slices shown (2 of 2)]
[im 1/46]
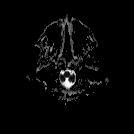
[im 23/46]
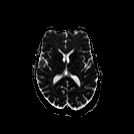
[im 46/46]
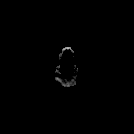

[Series 8: mip_images(sw) · axial · 24.0mm · 0.86mm/px · z∈[-15,+116]mm · 3 of 45 slices shown]
[im 1/45]
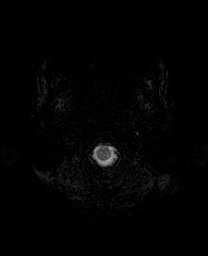
[im 23/45]
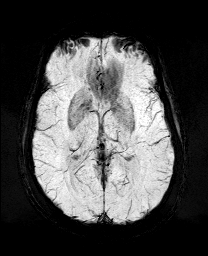
[im 45/45]
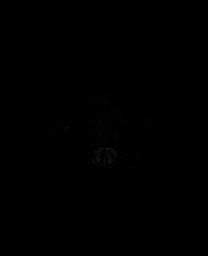

[Series 9: swi_images · axial · 3.0mm · 0.86mm/px · z∈[-26,+127]mm · 3 of 52 slices shown]
[im 1/52]
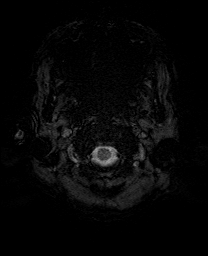
[im 26/52]
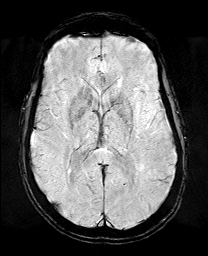
[im 52/52]
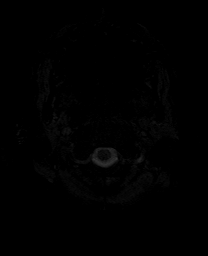

[Series 10: T2 · axial · 5.0mm · 0.57mm/px · z∈[-21,+122]mm · 2 of 25 slices shown]
[im 1/25]
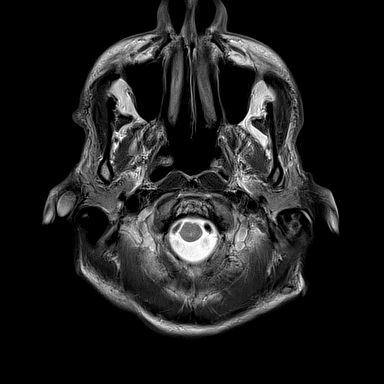
[im 25/25]
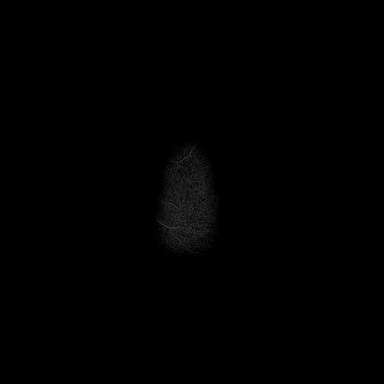

[Series 11: FLAIR · axial · 3.0mm · 0.57mm/px · z∈[-48,+92]mm · 3 of 50 slices shown]
[im 1/50]
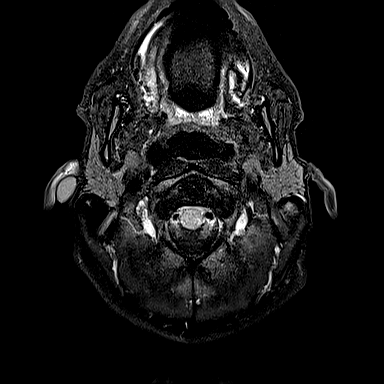
[im 25/50]
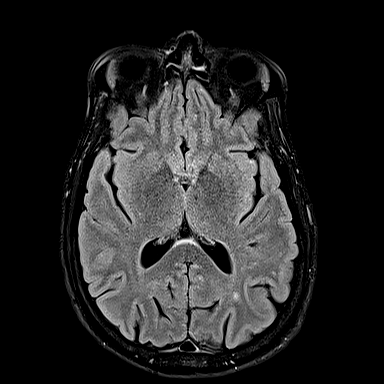
[im 50/50]
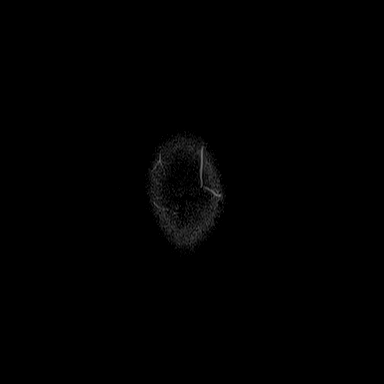

[Series 12: T1 · axial · 1.0mm · 0.94mm/px · z∈[-57,+95]mm · 10 of 160 slices shown (2 of 2)]
[im 1/160]
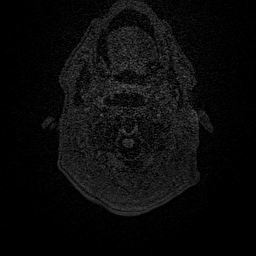
[im 18/160]
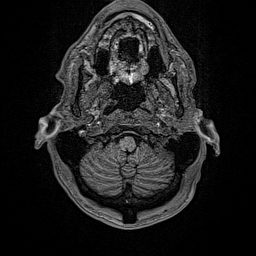
[im 36/160]
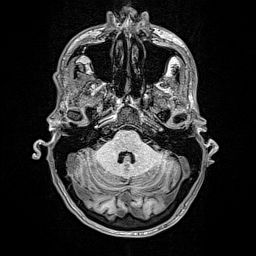
[im 54/160]
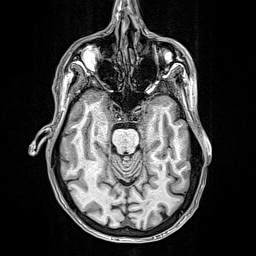
[im 71/160]
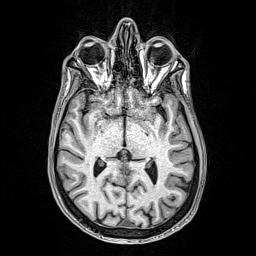
[im 89/160]
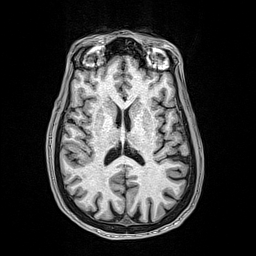
[im 107/160]
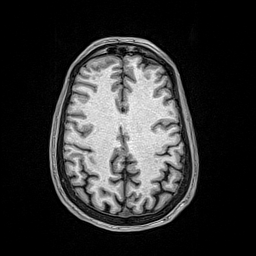
[im 124/160]
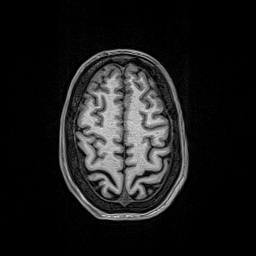
[im 142/160]
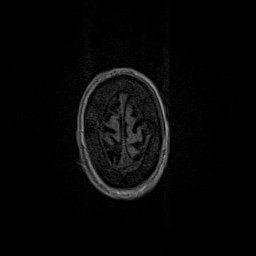
[im 160/160]
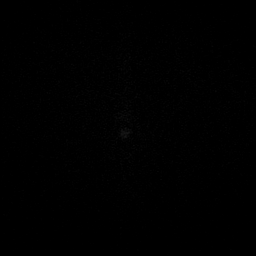

[Series 13: T2 post-contrast · coronal · 3.0mm · 0.57mm/px · 3 of 42 slices shown]
[im 1/42]
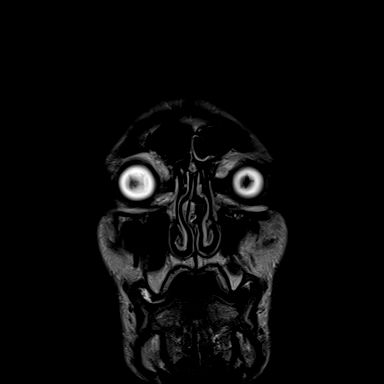
[im 21/42]
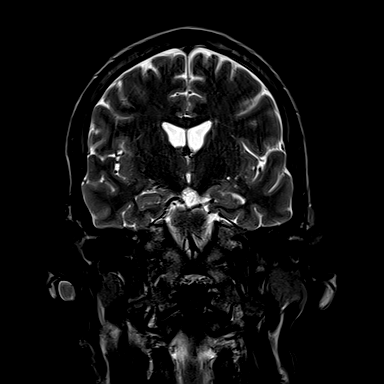
[im 42/42]
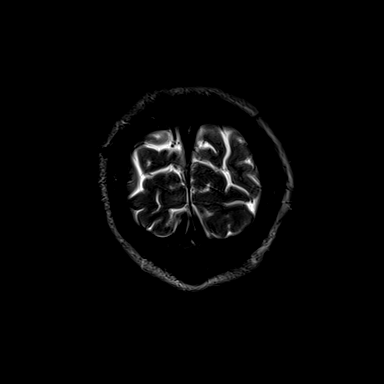

[Series 14: T1 post-contrast · sagittal · 3.0mm · 0.75mm/px · 2 of 32 slices shown (1 of 3)]
[im 1/32]
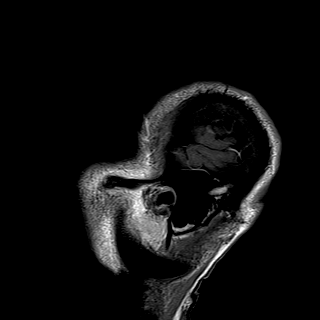
[im 32/32]
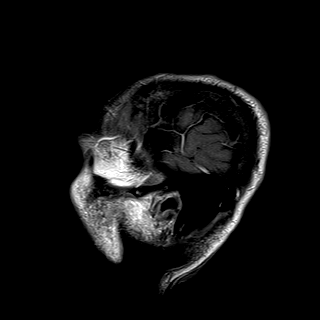

[Series 15: T1 post-contrast · axial · 1.0mm · 0.94mm/px · z∈[-57,+95]mm · 10 of 160 slices shown (2 of 3)]
[im 1/160]
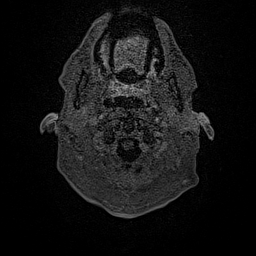
[im 18/160]
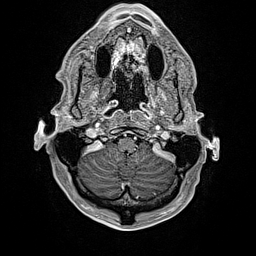
[im 36/160]
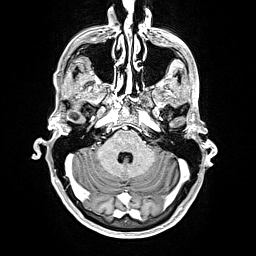
[im 54/160]
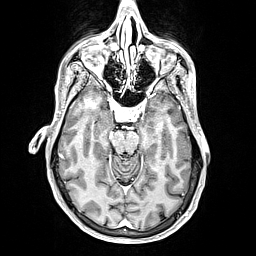
[im 71/160]
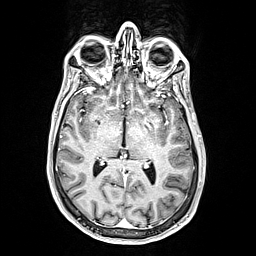
[im 89/160]
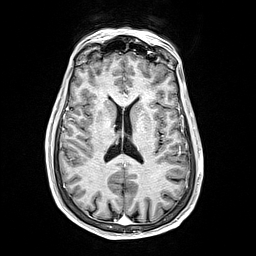
[im 107/160]
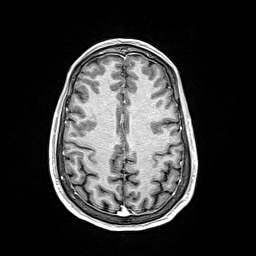
[im 124/160]
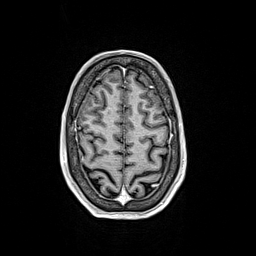
[im 142/160]
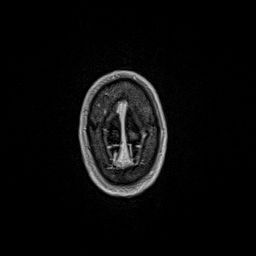
[im 160/160]
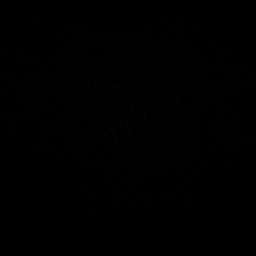

[Series 16: T1 post-contrast · coronal · 3.0mm · 0.57mm/px · 3 of 42 slices shown (3 of 3)]
[im 1/42]
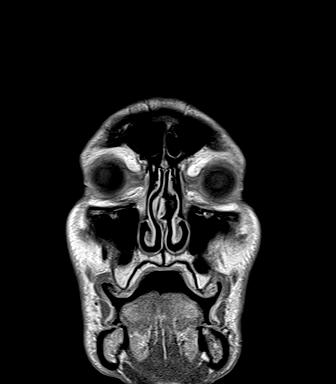
[im 21/42]
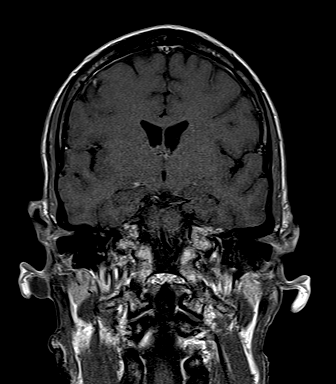
[im 42/42]
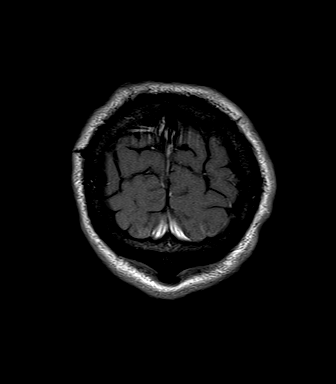

[48 of 48 positions shown; findings below may reference images not displayed]

FINDINGS: Brain: Cerebral volume within normal limits for age. Mild scattered
T2/FLAIR hyperintensity noted within the periventricular and deep
white matter both cerebral hemispheres, nonspecific, but most like
related chronic microvascular ischemic disease, mild for age.

No evidence for acute infarct. Gray-white matter differentiation
maintained. No encephalomalacia to suggest chronic cortical
infarction. No evidence for acute or chronic intracranial
hemorrhage.

11 mm focus of enhancement seen involving the cortex of the
parasagittal right parietal lobe (series 15, image 102). Additional
subtle 6 mm focus of patchy enhancement seen involving the right
cerebellar vermis (series 15, image 50). Findings suspicious for
possible metastatic disease. No associated mass effect or
hemorrhage.

No other mass lesion, mass effect, or midline shift. No
hydrocephalus or extra-axial fluid collection. Pituitary gland
suprasellar region within normal limits. Midline structures intact.

Vascular: Major intracranial vascular flow voids are maintained.

Skull and upper cervical spine: Craniocervical junction within
normal limits. Bone marrow signal intensity normal. 9 mm soft tissue
nodule present at the high right parietal scalp (series 11, image
44), indeterminate.

Sinuses/Orbits: Globes and orbital soft tissues demonstrate no acute
finding. Paranasal sinuses are clear. No significant mastoid
effusion. Inner ear structures grossly normal.

Other: None.
IMPRESSION: 1. Two small foci of patchy enhancement involving the cortical gray
matter of the right parietal lobe and right cerebellar vermis as
above, suspicious for metastatic disease. No associated edema or
mass effect.
2. 9 mm soft tissue nodule at the right parietal scalp,
indeterminate. Metastatic implant not excluded. Correlation with
physical exam recommended.
3. Underlying mild chronic microvascular ischemic disease.

## 2023-02-22 NOTE — Telephone Encounter (Signed)
Telephone call
